# Patient Record
Sex: Female | Born: 1995 | Race: Black or African American | Hispanic: No | Marital: Single | Smoking: Never smoker
Health system: Southern US, Community
[De-identification: ages and names within clinical notes are randomized; demographics above are authoritative.]

## PROBLEM LIST (undated history)

## (undated) ENCOUNTER — Inpatient Hospital Stay (HOSPITAL_COMMUNITY): Payer: Self-pay

## (undated) DIAGNOSIS — D563 Thalassemia minor: Secondary | ICD-10-CM

## (undated) DIAGNOSIS — F419 Anxiety disorder, unspecified: Secondary | ICD-10-CM

## (undated) DIAGNOSIS — D649 Anemia, unspecified: Secondary | ICD-10-CM

## (undated) DIAGNOSIS — B999 Unspecified infectious disease: Secondary | ICD-10-CM

## (undated) HISTORY — PX: NO PAST SURGERIES: SHX2092

---

## 2016-04-10 ENCOUNTER — Encounter (HOSPITAL_COMMUNITY): Payer: Self-pay | Admitting: Emergency Medicine

## 2016-04-10 ENCOUNTER — Emergency Department (HOSPITAL_COMMUNITY)
Admission: EM | Admit: 2016-04-10 | Discharge: 2016-04-10 | Discharge status: Against Medical Advice | Payer: BLUE CROSS/BLUE SHIELD | Attending: Dermatology | Admitting: Dermatology

## 2016-04-10 DIAGNOSIS — Z87891 Personal history of nicotine dependence: Secondary | ICD-10-CM | POA: Insufficient documentation

## 2016-04-10 DIAGNOSIS — Z5321 Procedure and treatment not carried out due to patient leaving prior to being seen by health care provider: Secondary | ICD-10-CM | POA: Insufficient documentation

## 2016-04-10 DIAGNOSIS — R42 Dizziness and giddiness: Secondary | ICD-10-CM | POA: Insufficient documentation

## 2016-04-10 LAB — CBC WITH DIFFERENTIAL/PLATELET
BASOS PCT: 1 %
Basophils Absolute: 0.1 10*3/uL (ref 0.0–0.1)
EOS ABS: 0.1 10*3/uL (ref 0.0–0.7)
EOS PCT: 1 %
HCT: 37.3 % (ref 36.0–46.0)
HEMOGLOBIN: 12 g/dL (ref 12.0–15.0)
LYMPHS ABS: 2.4 10*3/uL (ref 0.7–4.0)
Lymphocytes Relative: 46 %
MCH: 27.8 pg (ref 26.0–34.0)
MCHC: 32.2 g/dL (ref 30.0–36.0)
MCV: 86.5 fL (ref 78.0–100.0)
MONOS PCT: 6 %
Monocytes Absolute: 0.3 10*3/uL (ref 0.1–1.0)
NEUTROS PCT: 46 %
Neutro Abs: 2.4 10*3/uL (ref 1.7–7.7)
PLATELETS: 213 10*3/uL (ref 150–400)
RBC: 4.31 MIL/uL (ref 3.87–5.11)
RDW: 12.9 % (ref 11.5–15.5)
WBC: 5.3 10*3/uL (ref 4.0–10.5)

## 2016-04-10 LAB — COMPREHENSIVE METABOLIC PANEL
ALBUMIN: 3.8 g/dL (ref 3.5–5.0)
ALK PHOS: 90 U/L (ref 38–126)
ALT: 29 U/L (ref 14–54)
ANION GAP: 8 (ref 5–15)
AST: 20 U/L (ref 15–41)
BUN: 8 mg/dL (ref 6–20)
CHLORIDE: 104 mmol/L (ref 101–111)
CO2: 25 mmol/L (ref 22–32)
Calcium: 9.3 mg/dL (ref 8.9–10.3)
Creatinine, Ser: 0.61 mg/dL (ref 0.44–1.00)
GFR calc non Af Amer: >60 mL/min (ref >60)
GLUCOSE: 104 mg/dL — AB (ref 65–99)
POTASSIUM: 3.5 mmol/L (ref 3.5–5.1)
SODIUM: 137 mmol/L (ref 135–145)
Total Bilirubin: 0.3 mg/dL (ref 0.3–1.2)
Total Protein: 6.7 g/dL (ref 6.5–8.1)

## 2016-04-10 LAB — CBG MONITORING, ED: Glucose-Capillary: 99 mg/dL (ref 65–99)

## 2016-04-10 NOTE — ED Notes (Signed)
Pt st's today at work she started feeling lightheaded and shaky.  St;s this has happened before and she would feel better after she ate.  Pt st's today eating hasn't helped.  Pt denies nausea or vomiting

## 2016-04-11 ENCOUNTER — Encounter (HOSPITAL_COMMUNITY): Payer: Self-pay | Admitting: Emergency Medicine

## 2016-04-11 ENCOUNTER — Ambulatory Visit (HOSPITAL_COMMUNITY)
Admission: EM | Admit: 2016-04-11 | Discharge: 2016-04-11 | Disposition: A | Discharge status: Home / Self Care | Payer: Self-pay | Attending: Family Medicine | Admitting: Family Medicine

## 2016-04-11 DIAGNOSIS — R0602 Shortness of breath: Secondary | ICD-10-CM

## 2016-04-11 DIAGNOSIS — R42 Dizziness and giddiness: Secondary | ICD-10-CM

## 2016-04-11 DIAGNOSIS — F419 Anxiety disorder, unspecified: Secondary | ICD-10-CM

## 2016-04-11 MED ORDER — ALBUTEROL SULFATE HFA 108 (90 BASE) MCG/ACT IN AERS: 2.0000 | INHALATION_SPRAY | RESPIRATORY_TRACT | Status: DC | PRN

## 2016-04-11 MED ORDER — MECLIZINE HCL 25 MG PO TABS: 25.0000 mg | ORAL_TABLET | Freq: Three times a day (TID) | ORAL | Status: DC | PRN

## 2016-04-11 NOTE — ED Provider Notes (Signed)
CSN: [id number]     Arrival date & time 04/11/16  1534 History   None    Chief Complaint  Patient presents with  . Dizziness   (Consider location/radiation/quality/duration/timing/severity/associated sxs/prior Treatment) HPI Comments: Patient c/o sob when exercising and c/o exercised induced wheezing.  Patient is a 20 y.o. female presenting with dizziness.  Dizziness Quality:  Vertigo Severity:  Mild Onset quality:  Sudden Duration:  1 day Timing:  Intermittent Progression:  Waxing and waning Chronicity:  New Context: bending over and head movement   Relieved by:  Nothing Worsened by:  Nothing Associated symptoms: shortness of breath     History reviewed. No pertinent past medical history. History reviewed. No pertinent past surgical history. No family history on file. Social History  Substance Use Topics  . Smoking status: Former Games developermoker  . Smokeless tobacco: None  . Alcohol Use: No   OB History    No data available     Review of Systems  Constitutional: Negative.   HENT: Negative.   Eyes: Negative.   Respiratory: Positive for shortness of breath.   Cardiovascular: Negative.   Genitourinary: Negative.   Allergic/Immunologic: Negative.   Neurological: Positive for dizziness.  Hematological: Negative.   Psychiatric/Behavioral: Negative.     Allergies  Review of patient's allergies indicates no known allergies.  Home Medications   Prior to Admission medications   Not on File   Meds Ordered and Administered this Visit  Medications - No data to display  BP 143/74 mmHg  Pulse 87  Temp(Src) 98.9 F (37.2 C) (Oral)  Resp 16  SpO2 100%  LMP 03/31/2016 No data found.   Physical Exam  Constitutional: She is oriented to person, place, and time. She appears well-developed and well-nourished.  HENT:  Head: Normocephalic.  Right Ear: External ear normal.  Left Ear: External ear normal.  Mouth/Throat: Oropharynx is clear and moist.  Eyes: Conjunctivae  and EOM are normal. Pupils are equal, round, and reactive to light.  Neck: Normal range of motion. Neck supple.  Cardiovascular: Normal rate, regular rhythm and normal heart sounds.   Pulmonary/Chest: Effort normal and breath sounds normal.  Abdominal: Soft. Bowel sounds are normal.  Musculoskeletal: Normal range of motion.  Neurological: She is alert and oriented to person, place, and time.    ED Course  Procedures (including critical care time)  Labs Review Labs Reviewed - No data to display  Imaging Review No results found.   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

## 2016-04-11 NOTE — Discharge Instructions (Signed)
Follow up with Primary Care for anxiety and get physical exam.   Benign Positional Vertigo Vertigo is the feeling that you or your surroundings are moving when they are not. Benign positional vertigo is the most common form of vertigo. The cause of this condition is not serious (is benign). This condition is triggered by certain movements and positions (is positional). This condition can be dangerous if it occurs while you are doing something that could endanger you or others, such as driving.  CAUSES In many cases, the cause of this condition is not known. It may be caused by a disturbance in an area of the inner ear that helps your brain to sense movement and balance. This disturbance can be caused by a viral infection (labyrinthitis), head injury, or repetitive motion. RISK FACTORS This condition is more likely to develop in:  Women.  People who are 22 years of age or older. SYMPTOMS Symptoms of this condition usually happen when you move your head or your eyes in different directions. Symptoms may start suddenly, and they usually last for less than a minute. Symptoms may include:  Loss of balance and falling.  Feeling like you are spinning or moving.  Feeling like your surroundings are spinning or moving.  Nausea and vomiting.  Blurred vision.  Dizziness.  Involuntary eye movement (nystagmus). Symptoms can be mild and cause only slight annoyance, or they can be severe and interfere with daily life. Episodes of benign positional vertigo may return (recur) over time, and they may be triggered by certain movements. Symptoms may improve over time. DIAGNOSIS This condition is usually diagnosed by medical history and a physical exam of the head, neck, and ears. You may be referred to a health care provider who specializes in ear, nose, and throat (ENT) problems (otolaryngologist) or a provider who specializes in disorders of the nervous system (neurologist). You may have additional  testing, including:  MRI.  A CT scan.  Eye movement tests. Your health care provider may ask you to change positions quickly while he or she watches you for symptoms of benign positional vertigo, such as nystagmus. Eye movement may be tested with an electronystagmogram (ENG), caloric stimulation, the Dix-Hallpike test, or the roll test.  An electroencephalogram (EEG). This records electrical activity in your brain.  Hearing tests. TREATMENT Usually, your health care provider will treat this by moving your head in specific positions to adjust your inner ear back to normal. Surgery may be needed in severe cases, but this is rare. In some cases, benign positional vertigo may resolve on its own in 2-4 weeks. HOME CARE INSTRUCTIONS Safety  Move slowly.Avoid sudden body or head movements.  Avoid driving.  Avoid operating heavy machinery.  Avoid doing any tasks that would be dangerous to you or others if a vertigo episode would occur.  If you have trouble walking or keeping your balance, try using a cane for stability. If you feel dizzy or unstable, sit down right away.  Return to your normal activities as told by your health care provider. Ask your health care provider what activities are safe for you. General Instructions  Take over-the-counter and prescription medicines only as told by your health care provider.  Avoid certain positions or movements as told by your health care provider.  Drink enough fluid to keep your urine clear or pale yellow.  Keep all follow-up visits as told by your health care provider. This is important. SEEK MEDICAL CARE IF:  You have a fever.  Your  condition gets worse or you develop new symptoms.  Your family or friends notice any behavioral changes.  Your nausea or vomiting gets worse.  You have numbness or a "pins and needles" sensation. SEEK IMMEDIATE MEDICAL CARE IF:  You have difficulty speaking or moving.  You are always dizzy.  You  faint.  You develop severe headaches.  You have weakness in your legs or arms.  You have changes in your hearing or vision.  You develop a stiff neck.  You develop sensitivity to light.   This information is not intended to replace advice given to you by your health care provider. Make sure you discuss any questions you have with your health care provider.   Document Released: 07/21/2006 Document Revised: 07/04/2015 Document Reviewed: 02/05/2015 Elsevier Interactive Patient Education [employer]

## 2016-04-11 NOTE — ED Notes (Signed)
Pt reports she feels lightheaded onset yest... [facility] sx but left b/c wait is too long... Denies CP... A&O x4... No acute distress.

## 2016-05-16 ENCOUNTER — Ambulatory Visit (HOSPITAL_COMMUNITY)
Admission: EM | Admit: 2016-05-16 | Discharge: 2016-05-16 | Disposition: A | Discharge status: Home / Self Care | Payer: Self-pay | Attending: Family Medicine | Admitting: Family Medicine

## 2016-05-16 ENCOUNTER — Encounter (HOSPITAL_COMMUNITY): Payer: Self-pay | Admitting: *Deleted

## 2016-05-16 DIAGNOSIS — N390 Urinary tract infection, site not specified: Secondary | ICD-10-CM

## 2016-05-16 LAB — POCT URINALYSIS DIP (DEVICE)
Bilirubin Urine: NEGATIVE
Glucose, UA: NEGATIVE mg/dL
Ketones, ur: NEGATIVE mg/dL
Nitrite: NEGATIVE
SPECIFIC GRAVITY, URINE: 1.025 (ref 1.005–1.030)
UROBILINOGEN UA: 0.2 mg/dL (ref 0.0–1.0)
pH: 6.5 (ref 5.0–8.0)

## 2016-05-16 LAB — POCT PREGNANCY, URINE: Preg Test, Ur: NEGATIVE

## 2016-05-16 MED ORDER — CEPHALEXIN 500 MG PO CAPS: 500.0000 mg | ORAL_CAPSULE | Freq: Four times a day (QID) | ORAL | Status: DC

## 2016-05-16 NOTE — Discharge Instructions (Signed)
Take all of medicine as directed, drink lots of fluids, see your doctor if further problems. °

## 2016-05-16 NOTE — ED Provider Notes (Signed)
CSN: [id number]     Arrival date & time 05/16/16  1519 History   First MD Initiated Contact with Patient 05/16/16 1610     Chief Complaint  Patient presents with  . Urinary Tract Infection   (Consider location/radiation/quality/duration/timing/severity/associated sxs/prior Treatment) Patient is a 20 y.o. female presenting with urinary tract infection. The history is provided by the patient.  Urinary Tract Infection Pain quality:  Burning Pain severity:  Mild Onset quality:  Sudden Duration:  1 day Progression:  Unchanged Chronicity:  New Recent urinary tract infections: no   Relieved by:  None tried Worsened by:  Nothing tried Ineffective treatments:  None tried Urinary symptoms: frequent urination   Associated symptoms: no abdominal pain, no fever, no flank pain, no nausea, no vaginal discharge and no vomiting   Risk factors: no recurrent urinary tract infections     History reviewed. No pertinent past medical history. History reviewed. No pertinent past surgical history. History reviewed. No pertinent family history. Social History  Substance Use Topics  . Smoking status: Former Games developer  . Smokeless tobacco: None  . Alcohol Use: No   OB History    No data available     Review of Systems  Constitutional: Negative.  Negative for fever.  Gastrointestinal: Negative.  Negative for nausea, vomiting and abdominal pain.  Genitourinary: Positive for dysuria, urgency and frequency. Negative for flank pain, vaginal bleeding, vaginal discharge, vaginal pain and pelvic pain.  All other systems reviewed and are negative.   Allergies  Review of patient's allergies indicates no known allergies.  Home Medications   Prior to Admission medications   Medication Sig Start Date End Date Taking? Authorizing Provider  albuterol (PROVENTIL HFA;VENTOLIN HFA) 108 (90 Base) MCG/ACT inhaler Inhale 2 puffs into the lungs every 4 (four) hours as needed for wheezing or shortness of breath.  04/11/16   Deatra Canter, FNP  cephALEXin (KEFLEX) 500 MG capsule Take 1 capsule (500 mg total) by mouth 4 (four) times daily. Take all of medicine and drink lots of fluids 05/16/16   Linna Hoff, MD  meclizine (ANTIVERT) 25 MG tablet Take 1 tablet (25 mg total) by mouth 3 (three) times daily as needed for dizziness. 04/11/16   Deatra Canter, FNP   Meds Ordered and Administered this Visit  Medications - No data to display  BP 136/85 mmHg  Pulse 104  Temp(Src) 98.5 F (36.9 C) (Oral)  Resp 12  SpO2 99%  LMP 04/27/2016 No data found.   Physical Exam  Constitutional: She is oriented to person, place, and time. She appears well-developed and well-nourished.  Abdominal: Soft. Bowel sounds are normal. She exhibits no mass. There is no tenderness. There is no rebound and no guarding.  Neurological: She is alert and oriented to person, place, and time.  Skin: Skin is warm and dry.  Nursing note and vitals reviewed.   ED Course  Procedures (including critical care time)  Labs Review Labs Reviewed  POCT URINALYSIS DIP (DEVICE) - Abnormal; Notable for the following:    Hgb urine dipstick LARGE (*)    Protein, ur >=300 (*)    Leukocytes, UA LARGE (*)    All other components within normal limits  POCT PREGNANCY, URINE   U/a abnl. U/preg neg  Imaging Review No results found.   Visual Acuity Review  Right Eye Distance:   Left Eye Distance:   Bilateral Distance:    Right Eye Near:   Left Eye Near:    Bilateral Near:  MDM   1. UTI (lower urinary tract infection)        Linna HoffJames D Kaysin Brock, MD 05/16/16 [phone]

## 2016-05-16 NOTE — ED Notes (Signed)
Pt reports      Symptoms      Of     Blood  In urine   With         frequecy  And  Discomfort    With  Onset      Of symptoms      sev  Days   denys  Vaginal bleeding or discharge

## 2016-09-06 ENCOUNTER — Ambulatory Visit (HOSPITAL_COMMUNITY)
Admission: EM | Admit: 2016-09-06 | Discharge: 2016-09-06 | Disposition: A | Discharge status: Home / Self Care | Payer: BLUE CROSS/BLUE SHIELD | Attending: Internal Medicine | Admitting: Internal Medicine

## 2016-09-06 ENCOUNTER — Encounter (HOSPITAL_COMMUNITY): Payer: Self-pay | Admitting: Emergency Medicine

## 2016-09-06 DIAGNOSIS — B349 Viral infection, unspecified: Secondary | ICD-10-CM

## 2016-09-06 LAB — POCT URINALYSIS DIP (DEVICE)
Bilirubin Urine: NEGATIVE
GLUCOSE, UA: NEGATIVE mg/dL
Ketones, ur: NEGATIVE mg/dL
Leukocytes, UA: NEGATIVE
Nitrite: NEGATIVE
PROTEIN: NEGATIVE mg/dL
SPECIFIC GRAVITY, URINE: 1.02 (ref 1.005–1.030)
UROBILINOGEN UA: 0.2 mg/dL (ref 0.0–1.0)
pH: 7.5 (ref 5.0–8.0)

## 2016-09-06 LAB — POCT PREGNANCY, URINE: PREG TEST UR: NEGATIVE

## 2016-09-06 MED ORDER — FAMOTIDINE 40 MG PO TABS: 40.0000 mg | ORAL_TABLET | Freq: Two times a day (BID) | ORAL | 0 refills | Status: DC

## 2016-09-06 NOTE — Discharge Instructions (Addendum)
Symptoms of stomach upset and dizziness seem most likely to be related to a stomach bug.  Symptoms are improving, so no further workup necessary at this time.  Prescription for famotidine, for nausea/upper stomach discomfort, [facility] [address].  Recheck for new fever >100.5, persistent vomiting, or if not continuing to improve over the next several days.   Tongue piercing does not seem to be actively infected but would recheck for increasing swelling/pain/drainage/redness.

## 2016-09-06 NOTE — ED Triage Notes (Signed)
Onset 2 days ago, no appetite, no vomiting.  No diarrhea.  Reports lower abdominal cramping today that has since resolved.  Denies urinary symptoms, no vaginal discharge.  Last bm was today.  lmp October 22  Reported as normal.  Patient here for no appetite and because she felt dizzy for 2 days, not today

## 2016-09-06 NOTE — ED Provider Notes (Signed)
[facility]    CSN: [id number] Arrival date & time: 09/06/16  1327     History   Chief Complaint No chief complaint on file.   HPI Melanie Valencia is a 20 y.o. female. She presents today with the onset 3 days ago of nausea, emesis 1, malaise, lightheadedness. The symptoms are all improving, although she doesn't feel like herself yet. She is under a lot of stress, financial and relational, not sleeping at night. No dysuria, does have some frequency but also increased her water intake. Little bit of lower abdominal crampiness today, improved after a bowel movement. Normal bowel movement today. No unusual vaginal bleeding. Chronically has a tiny amount of vaginal discharge which is unchanged. No fever. She is worried that her tongue piercing is infected. She had this done about a month ago, 2 weeks ago noticed a little bit of swelling. No pain, no drainage.  HPI  History reviewed. No pertinent past medical history.  History reviewed. No pertinent surgical history.     Home Medications    Prior to Admission medications   Medication Sig Start Date End Date Taking? Authorizing Provider  albuterol (PROVENTIL HFA;VENTOLIN HFA) 108 (90 Base) MCG/ACT inhaler Inhale 2 puffs into the lungs every 4 (four) hours as needed for wheezing or shortness of breath. 04/11/16   Deatra CanterWilliam J Oxford, FNP  famotidine (PEPCID) 40 MG tablet Take 1 tablet (40 mg total) by mouth 2 (two) times daily. 09/06/16 09/20/16  Eustace MooreLaura W Xenia Nile, MD    Family History No family history on file.  Social History Social History  Substance Use Topics  . Smoking status: Former Games developermoker  . Smokeless tobacco: Not on file  . Alcohol use Yes     Allergies   Patient has no known allergies.   Review of Systems Review of Systems  All other systems reviewed and are negative.    Physical Exam Triage Vital Signs ED Triage Vitals [09/06/16 1445]  Enc Vitals Group     BP 129/74     Pulse Rate 96     Resp 18   Temp 99 F (37.2 C)     Temp Source Oral     SpO2 100 %     Weight      Height    No data found.   Updated Vital Signs BP 129/84 (BP Location: Left Arm)   Pulse 89   Temp 99.2 F (37.3 C) (Oral)   Resp 18   LMP 08/17/2016   SpO2 100%  Physical Exam  Constitutional: She is oriented to person, place, and time. No distress.  Alert, nicely groomed  HENT:  Head: Atraumatic.  Bar type piercing at the tip of the tongue, both ends are whitish, feel a little firm/fibrous, no drainage is expressible, no tenderness appreciated. No erythema, overall tongue not swollen.  Eyes:  Conjugate gaze, no eye redness/drainage  Neck: Neck supple.  Cardiovascular: Normal rate and regular rhythm.   Pulmonary/Chest: No respiratory distress. She has no wheezes. She has no rales.  Lungs clear, symmetric breath sounds  Abdominal: Soft. She exhibits no distension. There is no tenderness. There is no guarding.  Musculoskeletal: Normal range of motion.  No leg swelling  Neurological: She is alert and oriented to person, place, and time.  Skin: Skin is warm and dry.  No cyanosis  Nursing note and vitals reviewed.    UC Treatments / Results  Labs Results for orders placed or performed during the hospital encounter of 09/06/16  POCT  urinalysis dip (device)  Result Value Ref Range   Glucose, UA NEGATIVE NEGATIVE mg/dL   Bilirubin Urine NEGATIVE NEGATIVE   Ketones, ur NEGATIVE NEGATIVE mg/dL   Specific Gravity, Urine 1.020 1.005 - 1.030   Hgb urine dipstick TRACE (A) NEGATIVE   pH 7.5 5.0 - 8.0   Protein, ur NEGATIVE NEGATIVE mg/dL   Urobilinogen, UA 0.2 0.0 - 1.0 mg/dL   Nitrite NEGATIVE NEGATIVE   Leukocytes, UA NEGATIVE NEGATIVE  Pregnancy, urine POC  Result Value Ref Range   Preg Test, Ur NEGATIVE NEGATIVE    Procedures Procedures (including critical care time) None today  Final Clinical Impressions(s) / UC Diagnoses   Final diagnoses:  Nonspecific syndrome suggestive of viral  illness   Symptoms of stomach upset and dizziness seem most likely to be related to a stomach bug.  Symptoms are improving, so no further workup necessary at this time.  Prescription for famotidine, for nausea/upper stomach discomfort, [facility] [address]  Recheck for new fever >100.5, persistent vomiting, or if not continuing to improve over the next several days.   Tongue piercing does not seem to be actively infected but would recheck for increasing swelling/pain/drainage/redness.  New Prescriptions Discharge Medication List as of 09/06/2016  3:40 PM    START taking these medications   Details  famotidine (PEPCID) 40 MG tablet Take 1 tablet (40 mg total) by mouth 2 (two) times daily., Starting Sat 09/06/2016, Until Sat 09/20/2016, Normal         Eustace MooreLaura W Teagen Mcleary, MD 09/12/16 [phone]

## 2016-10-23 ENCOUNTER — Encounter (HOSPITAL_COMMUNITY): Payer: Self-pay

## 2016-10-23 ENCOUNTER — Emergency Department (HOSPITAL_COMMUNITY)
Admission: EM | Admit: 2016-10-23 | Discharge: 2016-10-23 | Disposition: A | Discharge status: Home / Self Care | Payer: BLUE CROSS/BLUE SHIELD | Attending: Emergency Medicine | Admitting: Emergency Medicine

## 2016-10-23 DIAGNOSIS — Z87891 Personal history of nicotine dependence: Secondary | ICD-10-CM | POA: Diagnosis not present

## 2016-10-23 DIAGNOSIS — N390 Urinary tract infection, site not specified: Secondary | ICD-10-CM | POA: Diagnosis not present

## 2016-10-23 DIAGNOSIS — R300 Dysuria: Secondary | ICD-10-CM | POA: Diagnosis present

## 2016-10-23 DIAGNOSIS — Z79899 Other long term (current) drug therapy: Secondary | ICD-10-CM | POA: Diagnosis not present

## 2016-10-23 LAB — BASIC METABOLIC PANEL
ANION GAP: 9 (ref 5–15)
BUN: 8 mg/dL (ref 6–20)
CO2: 26 mmol/L (ref 22–32)
Calcium: 9.3 mg/dL (ref 8.9–10.3)
Chloride: 105 mmol/L (ref 101–111)
Creatinine, Ser: 0.61 mg/dL (ref 0.44–1.00)
GFR calc Af Amer: >60 mL/min (ref >60)
GLUCOSE: 108 mg/dL — AB (ref 65–99)
POTASSIUM: 3.6 mmol/L (ref 3.5–5.1)
Sodium: 140 mmol/L (ref 135–145)

## 2016-10-23 LAB — URINALYSIS, ROUTINE W REFLEX MICROSCOPIC
Bilirubin Urine: NEGATIVE
GLUCOSE, UA: NEGATIVE mg/dL
Ketones, ur: NEGATIVE mg/dL
NITRITE: NEGATIVE
PROTEIN: 100 mg/dL — AB
Specific Gravity, Urine: 1.018 (ref 1.005–1.030)
pH: 7 (ref 5.0–8.0)

## 2016-10-23 LAB — POC URINE PREG, ED: Preg Test, Ur: NEGATIVE

## 2016-10-23 LAB — RAPID HIV SCREEN (HIV 1/2 AB+AG)
HIV 1/2 Antibodies: NONREACTIVE
HIV-1 P24 Antigen - HIV24: NONREACTIVE

## 2016-10-23 MED ORDER — CEPHALEXIN 500 MG PO CAPS: 500.0000 mg | ORAL_CAPSULE | Freq: Two times a day (BID) | ORAL | 0 refills | Status: DC

## 2016-10-23 NOTE — ED Notes (Signed)
Pt. To bathroom.

## 2016-10-23 NOTE — ED Triage Notes (Signed)
Pt reports urinary frequency. She also reports a small amount of urinary frequency. She reports burning with urination. She reports hx of UTI.

## 2016-10-23 NOTE — ED Provider Notes (Signed)
[facility]   CSN: [id number] Arrival date & time: 10/23/16  1254     History   Chief Complaint Chief Complaint  Patient presents with  . Dysuria    HPI Melanie Valencia is a 20 y.o. female.  Pt reports urinary frequency. She also reports a small amount of urinary urgency. She reports burning with urination. She reports hx of UTI. No vomiting, no flank pain. Mild suprapubic pain. No history of prior surgeries. Patient denies any vaginal discharge.   The history is provided by the patient. No language interpreter was used.  Dysuria   This is a new problem. The current episode started 12 to 24 hours ago. The problem occurs every urination. The problem has been gradually worsening. The quality of the pain is described as burning and shooting. The pain is mild. There has been no fever. She is sexually active. Associated symptoms include urgency. Pertinent negatives include no vomiting, no frequency, no hematuria and no possible pregnancy. She has tried nothing for the symptoms. Her past medical history is significant for recurrent UTIs. Her past medical history does not include urological procedure or catheterization.    History reviewed. No pertinent past medical history.  There are no active problems to display for this patient.   History reviewed. No pertinent surgical history.  OB History    No data available       Home Medications    Prior to Admission medications   Medication Sig Start Date End Date Taking? Authorizing Provider  albuterol (PROVENTIL HFA;VENTOLIN HFA) 108 (90 Base) MCG/ACT inhaler Inhale 2 puffs into the lungs every 4 (four) hours as needed for wheezing or shortness of breath. 04/11/16   Deatra CanterWilliam J Oxford, FNP  cephALEXin (KEFLEX) 500 MG capsule Take 1 capsule (500 mg total) by mouth 2 (two) times daily. 10/23/16   Niel Hummeross Knight Oelkers, MD  famotidine (PEPCID) 40 MG tablet Take 1 tablet (40 mg total) by mouth 2 (two) times daily. 09/06/16 09/20/16   Eustace MooreLaura W Murray, MD    Family History History reviewed. No pertinent family history.  Social History Social History  Substance Use Topics  . Smoking status: Former Games developermoker  . Smokeless tobacco: Never Used  . Alcohol use Yes     Allergies   Patient has no known allergies.   Review of Systems Review of Systems  Gastrointestinal: Negative for vomiting.  Genitourinary: Positive for dysuria and urgency. Negative for frequency and hematuria.  All other systems reviewed and are negative.    Physical Exam Updated Vital Signs BP 129/69 (BP Location: Left Arm)   Pulse 90   Temp 99.1 F (37.3 C) (Oral)   Resp 18   LMP 10/15/2016 (Within Days)   SpO2 100%   Physical Exam  Constitutional: She is oriented to person, place, and time. She appears well-developed and well-nourished.  HENT:  Head: Normocephalic and atraumatic.  Right Ear: External ear normal.  Left Ear: External ear normal.  Mouth/Throat: Oropharynx is clear and moist.  Eyes: Conjunctivae and EOM are normal.  Neck: Normal range of motion. Neck supple.  Cardiovascular: Normal rate, normal heart sounds and intact distal pulses.   Pulmonary/Chest: Effort normal and breath sounds normal. She has no wheezes. She has no rales.  Abdominal: Soft. Bowel sounds are normal. She exhibits no mass. There is no tenderness. There is no rebound and no guarding.  Musculoskeletal: Normal range of motion.  Neurological: She is alert and oriented to person, place, and time. She displays normal reflexes.  Coordination normal.  Skin: Skin is warm.  Nursing note and vitals reviewed.    ED Treatments / Results  Labs (all labs ordered are listed, but only abnormal results are displayed) Labs Reviewed  URINE CULTURE - Abnormal; Notable for the following:       Result Value   Culture MULTIPLE SPECIES PRESENT, SUGGEST RECOLLECTION (*)    All other components within normal limits  URINALYSIS, ROUTINE W REFLEX MICROSCOPIC - Abnormal;  Notable for the following:    APPearance CLOUDY (*)    Hgb urine dipstick LARGE (*)    Protein, ur 100 (*)    Leukocytes, UA MODERATE (*)    Bacteria, UA RARE (*)    Squamous Epithelial / LPF 0-5 (*)    All other components within normal limits  BASIC METABOLIC PANEL - Abnormal; Notable for the following:    Glucose, Bld 108 (*)    All other components within normal limits  RAPID HIV SCREEN (HIV 1/2 AB+AG)  RPR  POC URINE PREG, ED    EKG  EKG Interpretation None       Radiology No results found.  Procedures Procedures (including critical care time)  Medications Ordered in ED Medications - No data to display   Initial Impression / Assessment and Plan / ED Course  I have reviewed the triage vital signs and the nursing notes.  Pertinent labs & imaging results that were available during my care of the patient were reviewed by me and considered in my medical decision making (see chart for details).  Clinical Course     20 year old who presents with dysuria, urinary frequency and urgency. Concern for likely UTI. We'll obtain UA and urine pregnancy.  Patient asking to be tested for HIV, so will send rapid test. We'll also check renal function.  Pt with likely UTI.  We'll place patient on antibiotics.   Will notify patient of results at [phone].   Called on 12/30 and notfied of results of HIV, and renal fx.   Final Clinical Impressions(s) / ED Diagnoses   Final diagnoses:  Lower urinary tract infectious disease    New Prescriptions Discharge Medication List as of 10/23/2016  4:26 PM    START taking these medications   Details  cephALEXin (KEFLEX) 500 MG capsule Take 1 capsule (500 mg total) by mouth 2 (two) times daily., Starting Thu 10/23/2016, Print         Niel Hummeross Davonne Baby, MD 10/25/16 [phone]

## 2016-10-24 LAB — RPR: RPR Ser Ql: NONREACTIVE

## 2016-10-25 LAB — URINE CULTURE

## 2017-04-28 ENCOUNTER — Encounter (HOSPITAL_COMMUNITY): Payer: Self-pay | Admitting: *Deleted

## 2017-04-28 ENCOUNTER — Ambulatory Visit (HOSPITAL_COMMUNITY)
Admission: EM | Admit: 2017-04-28 | Discharge: 2017-04-28 | Disposition: A | Discharge status: Home / Self Care | Payer: BLUE CROSS/BLUE SHIELD | Attending: Internal Medicine | Admitting: Internal Medicine

## 2017-04-28 DIAGNOSIS — R1031 Right lower quadrant pain: Secondary | ICD-10-CM

## 2017-04-28 DIAGNOSIS — R1030 Lower abdominal pain, unspecified: Secondary | ICD-10-CM | POA: Insufficient documentation

## 2017-04-28 DIAGNOSIS — Z3202 Encounter for pregnancy test, result negative: Secondary | ICD-10-CM

## 2017-04-28 DIAGNOSIS — R300 Dysuria: Secondary | ICD-10-CM

## 2017-04-28 DIAGNOSIS — Z79899 Other long term (current) drug therapy: Secondary | ICD-10-CM | POA: Insufficient documentation

## 2017-04-28 DIAGNOSIS — R1032 Left lower quadrant pain: Secondary | ICD-10-CM

## 2017-04-28 DIAGNOSIS — N898 Other specified noninflammatory disorders of vagina: Secondary | ICD-10-CM | POA: Insufficient documentation

## 2017-04-28 LAB — POCT URINALYSIS DIP (DEVICE)
Bilirubin Urine: NEGATIVE
GLUCOSE, UA: NEGATIVE mg/dL
Ketones, ur: NEGATIVE mg/dL
Nitrite: NEGATIVE
Protein, ur: NEGATIVE mg/dL
SPECIFIC GRAVITY, URINE: 1.015 (ref 1.005–1.030)
UROBILINOGEN UA: 0.2 mg/dL (ref 0.0–1.0)
pH: 7 (ref 5.0–8.0)

## 2017-04-28 LAB — POCT PREGNANCY, URINE: Preg Test, Ur: NEGATIVE

## 2017-04-28 NOTE — ED Triage Notes (Addendum)
Pt  Reports  Low   abd    Cramping /  Pressure    With  Scanty  Out  Put        Cloudy   Urine     hix  Of  Recurrent  uti   In past   Pt had  Vaginal     approx  1   Week  Ago  And  It  [employer]  Away

## 2017-04-28 NOTE — ED Provider Notes (Addendum)
CSN: [id number]     Arrival date & time 04/28/17  1114 History   First MD Initiated Contact with Patient 04/28/17 1215     Chief Complaint  Patient presents with  . Urinary Tract Infection   (Consider location/radiation/quality/duration/timing/severity/associated sxs/prior Treatment) HPI Melanie Valencia is a 21 y.o. female presenting to UC with c/o lower abdominal cramping/pressure with small urine output, cloudy urine.  Hx of recurrent UTIs in the past.  She did have small amount of vaginal discharge about 1 week ago but then it went away. Pt does not believe she has an STI but would not mind being tested while here.  Denies fever, chills, n/v/d.    History reviewed. No pertinent past medical history. History reviewed. No pertinent surgical history. History reviewed. No pertinent family history. Social History  Substance Use Topics  . Smoking status: Former Games developer  . Smokeless tobacco: Never Used  . Alcohol use Yes   OB History    No data available     Review of Systems  Constitutional: Positive for fever (in triage). Negative for chills.  Gastrointestinal: Positive for abdominal pain (lower cramping, pressure). Negative for diarrhea, nausea and vomiting.  Genitourinary: Positive for decreased urine volume, dysuria, frequency, urgency and vaginal discharge. Negative for flank pain, hematuria, pelvic pain, vaginal bleeding and vaginal pain.  Musculoskeletal: Negative for back pain and myalgias.  Neurological: Negative for dizziness, light-headedness and headaches.    Allergies  Patient has no known allergies.  Home Medications   Prior to Admission medications   Medication Sig Start Date End Date Taking? Authorizing Provider  albuterol (PROVENTIL HFA;VENTOLIN HFA) 108 (90 Base) MCG/ACT inhaler Inhale 2 puffs into the lungs every 4 (four) hours as needed for wheezing or shortness of breath. 04/11/16   Deatra Canter, FNP  cephALEXin (KEFLEX) 500 MG capsule Take 1 capsule (500 mg  total) by mouth 2 (two) times daily. 10/23/16   Niel Hummer, MD  famotidine (PEPCID) 40 MG tablet Take 1 tablet (40 mg total) by mouth 2 (two) times daily. 09/06/16 09/20/16  Eustace Moore, MD   Meds Ordered and Administered this Visit  Medications - No data to display  BP (!) 132/58 (BP Location: Right Arm)   Pulse (!) 102   Temp 100.1 F (37.8 C) (Oral)   Resp 18   LMP 03/31/2017   SpO2 100%  No data found.   Physical Exam  Constitutional: She is oriented to person, place, and time. She appears well-developed and well-nourished. No distress.  HENT:  Head: Normocephalic and atraumatic.  Mouth/Throat: Oropharynx is clear and moist.  Eyes: EOM are normal.  Neck: Normal range of motion.  Cardiovascular: Normal rate and regular rhythm.   Pulmonary/Chest: Effort normal and breath sounds normal. No respiratory distress.  Abdominal: Soft. She exhibits no distension and no mass. There is tenderness (suprapubic). There is no rebound, no guarding and no CVA tenderness.  Genitourinary: Vagina normal.  Genitourinary Comments: Chaperoned exam. Normal external genitalia. Small amount of thick white discharge in vaginal canal. No bleeding. No CMT, adnexal tenderness or masses.   Musculoskeletal: Normal range of motion.  Neurological: She is alert and oriented to person, place, and time.  Skin: Skin is warm and dry. She is not diaphoretic.  Psychiatric: She has a normal mood and affect. Her behavior is normal.  Nursing note and vitals reviewed.   Urgent Care Course     Procedures (including critical care time)  Labs Review Labs Reviewed  POCT URINALYSIS DIP (DEVICE) -  Abnormal; Notable for the following:       Result Value   Hgb urine dipstick TRACE (*)    Leukocytes, UA SMALL (*)    All other components within normal limits  URINE CULTURE  POCT PREGNANCY, URINE  CERVICOVAGINAL ANCILLARY ONLY    Imaging Review No results found.    MDM   1. Dysuria   2. Bilateral lower  abdominal cramping   3. Vaginal discharge    UA not convincing for definite UTI, will send culture. GC/Chlamydia and Wet prep labs sent off  Encouraged good hydrated. May have acetaminophen and ibuprofen as needed for pain F/u with PCP as needed Refrain from sexual activities with partner until labs result.     Lurene Shadowhelps, Analisse Randle O, PA-C 04/28/17 1257    Lurene ShadowPhelps, Kaziyah Parkison O, PA-C 04/28/17 1332

## 2017-04-28 NOTE — Discharge Instructions (Signed)
°  Refrain from sexual intercourse until lab results come back.  If anything is positive, the correct medication will be called in for you.  If you do not hear back from our office in 3-4 days, please call to check on your lab results or you may check on you MyChart account. Practice safe sex by always wearing condoms.   Be sure to stay well hydrated. You may take 500mg  acetaminophen every 4-6 hours or in combination with ibuprofen 400-600mg  every 6-8 hours.    No Primary Care Doctor: Call [facility] at  [phone] - they can help you locate a primary care doctor that  accepts your insurance, provides certain services, etc. Physician Referral Service- [phone]

## 2017-04-30 LAB — URINE CULTURE: Culture: >=100000 — AB

## 2017-04-30 LAB — CERVICOVAGINAL ANCILLARY ONLY
Bacterial vaginitis: NEGATIVE
Candida vaginitis: POSITIVE — AB
Chlamydia: NEGATIVE
Neisseria Gonorrhea: NEGATIVE
Trichomonas: NEGATIVE

## 2017-05-01 ENCOUNTER — Telehealth (HOSPITAL_COMMUNITY): Payer: Self-pay | Admitting: *Deleted

## 2017-05-01 MED ORDER — FLUCONAZOLE 150 MG PO TABS: 150.0000 mg | ORAL_TABLET | Freq: Once | ORAL | 0 refills | Status: AC

## 2017-05-01 NOTE — Telephone Encounter (Signed)
Test for candida (yeast) was also positive.  Rx fluconazole was sent to the pharmacy of record, [facility] on [address] at [address] [place] [place].  Recheck for further evaluation if symptoms are not improving.  LM

## 2017-05-25 ENCOUNTER — Encounter (HOSPITAL_COMMUNITY): Payer: Self-pay | Admitting: Family Medicine

## 2017-05-25 ENCOUNTER — Ambulatory Visit (HOSPITAL_COMMUNITY)
Admission: EM | Admit: 2017-05-25 | Discharge: 2017-05-25 | Disposition: A | Discharge status: Home / Self Care | Payer: Self-pay | Attending: Family Medicine | Admitting: Family Medicine

## 2017-05-25 DIAGNOSIS — R42 Dizziness and giddiness: Secondary | ICD-10-CM | POA: Insufficient documentation

## 2017-05-25 DIAGNOSIS — N3000 Acute cystitis without hematuria: Secondary | ICD-10-CM | POA: Insufficient documentation

## 2017-05-25 LAB — POCT URINALYSIS DIP (DEVICE)
BILIRUBIN URINE: NEGATIVE
GLUCOSE, UA: NEGATIVE mg/dL
Hgb urine dipstick: NEGATIVE
KETONES UR: NEGATIVE mg/dL
Nitrite: NEGATIVE
Protein, ur: NEGATIVE mg/dL
SPECIFIC GRAVITY, URINE: 1.02 (ref 1.005–1.030)
Urobilinogen, UA: 0.2 mg/dL (ref 0.0–1.0)
pH: 7 (ref 5.0–8.0)

## 2017-05-25 MED ORDER — SULFAMETHOXAZOLE-TRIMETHOPRIM 800-160 MG PO TABS: 1.0000 | ORAL_TABLET | Freq: Two times a day (BID) | ORAL | 0 refills | Status: AC

## 2017-05-25 NOTE — ED Triage Notes (Addendum)
Pt  Has   2   Complaints        She   Reports   Got   dzzy  Today  At  Work   And  Also wants  To be  Followed  Up  With  Her  Yeast  Infection   Pt  Ambulating  With a  Steady  Fluid  Gait   Appearing in no  Severe   Distress     Skin is  Warm  And  Dry

## 2017-05-26 LAB — URINE CULTURE

## 2017-05-27 NOTE — ED Provider Notes (Signed)
  [facility]   [id number] 05/25/17 Arrival Time: 1406  ASSESSMENT & PLAN:  1. Acute cystitis without hematuria   2. Dizziness     Meds ordered this encounter  Medications  . sulfamethoxazole-trimethoprim (BACTRIM DS,SEPTRA DS) 800-160 MG tablet    Sig: Take 1 tablet by mouth 2 (two) times daily.    Dispense:  6 tablet    Refill:  0   Will treat for possible UTI. This could explain her symptoms today. Culture sent. Close observation. F/U if needed. Reviewed expectations re: course of current medical issues. Questions answered. Outlined signs and symptoms indicating need for more acute intervention. Patient verbalized understanding. After Visit Summary given.   SUBJECTIVE:  Melanie Valencia is a 21 y.o. female who presents with complaint of lightheadedness today at work. No vertigo. "Just didn't feel well". Afebrile. No recent illnesses. Symptoms lasted several minutes then resolved. No n/v/CP/SOB. Normal PO intake today. Works at [employer]. No HA/visual changes/hearing changes. No new medications.  Also reports mild dysuria noticed today. No vaginal discharge. No abdominal symptoms. No back pain.  ROS: As per HPI.   OBJECTIVE:  Vitals:   05/25/17 1504  BP: 126/73  Pulse: 96  Resp: 16  Temp: 98.8 F (37.1 C)  TempSrc: Oral  SpO2: 100%     General appearance: alert; no distress HEENT: normocephalic; atraumatic Neck: supple Lungs: clear to auscultation bilaterally Heart: regular rate and rhythm Abdomen: soft, non-tender; bowel sounds normal; no masses or organomegaly; no guarding or rebound tenderness Back: no CVA tenderness Extremities: no cyanosis or edema; symmetrical with no gross deformities Skin: warm and dry Neurologic: normal symmetric reflexes; normal gait Psychological:  alert and cooperative; normal mood and affect  U/A with leukocytes.   No Known Allergies  PMHx, SurgHx, SocialHx, Medications, and Allergies were reviewed in the Visit  Navigator and updated as appropriate.      Mardella LaymanHagler, Emmalina Espericueta, MD 05/27/17 1022

## 2017-07-14 ENCOUNTER — Encounter (HOSPITAL_COMMUNITY): Payer: Self-pay | Admitting: Emergency Medicine

## 2017-07-14 ENCOUNTER — Ambulatory Visit (HOSPITAL_COMMUNITY)
Admission: EM | Admit: 2017-07-14 | Discharge: 2017-07-14 | Disposition: A | Discharge status: Home / Self Care | Payer: Self-pay | Attending: Family Medicine | Admitting: Family Medicine

## 2017-07-14 ENCOUNTER — Other Ambulatory Visit (HOSPITAL_COMMUNITY): Payer: Self-pay | Admitting: Family Medicine

## 2017-07-14 DIAGNOSIS — Z87891 Personal history of nicotine dependence: Secondary | ICD-10-CM | POA: Insufficient documentation

## 2017-07-14 DIAGNOSIS — N3090 Cystitis, unspecified without hematuria: Secondary | ICD-10-CM

## 2017-07-14 DIAGNOSIS — N3091 Cystitis, unspecified with hematuria: Secondary | ICD-10-CM | POA: Insufficient documentation

## 2017-07-14 LAB — POCT URINALYSIS DIP (DEVICE)
BILIRUBIN URINE: NEGATIVE
GLUCOSE, UA: NEGATIVE mg/dL
Ketones, ur: NEGATIVE mg/dL
NITRITE: NEGATIVE
PH: 7 (ref 5.0–8.0)
Protein, ur: 30 mg/dL — AB
Specific Gravity, Urine: <=1.005 (ref 1.005–1.030)
Urobilinogen, UA: 0.2 mg/dL (ref 0.0–1.0)

## 2017-07-14 LAB — POCT PREGNANCY, URINE: Preg Test, Ur: NEGATIVE

## 2017-07-14 MED ORDER — SULFAMETHOXAZOLE-TRIMETHOPRIM 800-160 MG PO TABS: 1.0000 | ORAL_TABLET | Freq: Two times a day (BID) | ORAL | 0 refills | Status: DC

## 2017-07-14 NOTE — Discharge Instructions (Signed)
Consider [facility] or [facility] for assistance with contraception

## 2017-07-14 NOTE — ED Provider Notes (Signed)
  [facility]   [id number] 07/14/17 Arrival Time: 1324   SUBJECTIVE:  Melanie Valencia is a 21 y.o. female who presents to the urgent care with complaint of urinary frequency, burning, and blood in urine Symptoms began today.  LMP last week at expected time.  Mild LBP but no fever, nausea, vomiting or flank pain  H/O recurrent UTI after intercourse in past  Last intercourse (unprotected) was 10 days ago.    History reviewed. No pertinent past medical history. No family history on file. Social History   Social History  . Marital status: Single    Spouse name: N/A  . Number of children: N/A  . Years of education: N/A   Occupational History  . Not on file.   Social History Main Topics  . Smoking status: Former Games developer  . Smokeless tobacco: Never Used  . Alcohol use Yes  . Drug use: No  . Sexual activity: Yes    Birth control/ protection: None   Other Topics Concern  . Not on file   Social History Narrative  . No narrative on file   No outpatient prescriptions have been marked as taking for the 07/14/17 encounter [facility] Encounter).   No Known Allergies    ROS: As per HPI, remainder of ROS negative.   OBJECTIVE:   Vitals:   07/14/17 1341  Weight: 170 lb (77.1 kg)  Height:  (1.6 m)     General appearance: alert; no distress Eyes: PERRL; EOMI; conjunctiva normal HENT: normocephalic; atraumatic; TMs normal, canal normal, external ears normal without trauma; nasal mucosa normal; oral mucosa normal Neck: supple Lungs: clear to auscultation bilaterally Heart: regular rate and rhythm Abdomen: soft, non-tender; bowel sounds normal; no masses or organomegaly; no guarding or rebound tenderness Back: no CVA tenderness, mild LBP Extremities: no cyanosis or edema; symmetrical with no gross deformities Skin: warm and dry Neurologic: normal gait; grossly normal Psychological: alert and cooperative; normal mood and affect     Labs:  Results for  orders placed or performed during the hospital encounter of 07/14/17  POCT urinalysis dip (device)  Result Value Ref Range   Glucose, UA NEGATIVE NEGATIVE mg/dL   Bilirubin Urine NEGATIVE NEGATIVE   Ketones, ur NEGATIVE NEGATIVE mg/dL   Specific Gravity, Urine <=1.005 1.005 - 1.030   Hgb urine dipstick MODERATE (A) NEGATIVE   pH 7.0 5.0 - 8.0   Protein, ur 30 (A) NEGATIVE mg/dL   Urobilinogen, UA 0.2 0.0 - 1.0 mg/dL   Nitrite NEGATIVE NEGATIVE   Leukocytes, UA SMALL (A) NEGATIVE    Labs Reviewed  POCT URINALYSIS DIP (DEVICE) - Abnormal; Notable for the following:       Result Value   Hgb urine dipstick MODERATE (*)    Protein, ur 30 (*)    Leukocytes, UA SMALL (*)    All other components within normal limits  URINE CULTURE    No results found.     ASSESSMENT & PLAN:  1. Cystitis     Meds ordered this encounter  Medications  . sulfamethoxazole-trimethoprim (BACTRIM DS,SEPTRA DS) 800-160 MG tablet    Sig: Take 1 tablet by mouth 2 (two) times daily.    Dispense:  14 tablet    Refill:  0    Reviewed expectations re: course of current medical issues. Questions answered. Outlined signs and symptoms indicating need for more acute intervention. Patient verbalized understanding. After Visit Summary given.    Procedures:      Elvina Sidle, MD 07/14/17 1359

## 2017-07-14 NOTE — ED Triage Notes (Signed)
PT reports urinary frequency, burning, and blood in urine. Started today.

## 2017-07-15 ENCOUNTER — Emergency Department (HOSPITAL_COMMUNITY): Payer: Self-pay

## 2017-07-15 ENCOUNTER — Encounter (HOSPITAL_COMMUNITY): Payer: Self-pay | Admitting: *Deleted

## 2017-07-15 ENCOUNTER — Emergency Department (HOSPITAL_COMMUNITY)
Admission: EM | Admit: 2017-07-15 | Discharge: 2017-07-16 | Disposition: A | Discharge status: Home / Self Care | Payer: Self-pay | Attending: Physician Assistant | Admitting: Physician Assistant

## 2017-07-15 DIAGNOSIS — R42 Dizziness and giddiness: Secondary | ICD-10-CM | POA: Insufficient documentation

## 2017-07-15 DIAGNOSIS — R300 Dysuria: Secondary | ICD-10-CM | POA: Insufficient documentation

## 2017-07-15 DIAGNOSIS — R51 Headache: Secondary | ICD-10-CM | POA: Insufficient documentation

## 2017-07-15 DIAGNOSIS — Z87891 Personal history of nicotine dependence: Secondary | ICD-10-CM | POA: Insufficient documentation

## 2017-07-15 DIAGNOSIS — R110 Nausea: Secondary | ICD-10-CM | POA: Insufficient documentation

## 2017-07-15 DIAGNOSIS — N3001 Acute cystitis with hematuria: Secondary | ICD-10-CM | POA: Insufficient documentation

## 2017-07-15 LAB — I-STAT TROPONIN, ED: Troponin i, poc: 0 ng/mL (ref 0.00–0.08)

## 2017-07-15 LAB — I-STAT CHEM 8, ED
BUN: 6 mg/dL (ref 6–20)
CHLORIDE: 104 mmol/L (ref 101–111)
Calcium, Ion: 1.1 mmol/L — ABNORMAL LOW (ref 1.15–1.40)
Creatinine, Ser: 0.7 mg/dL (ref 0.44–1.00)
Glucose, Bld: 113 mg/dL — ABNORMAL HIGH (ref 65–99)
HEMATOCRIT: 37 % (ref 36.0–46.0)
Hemoglobin: 12.6 g/dL (ref 12.0–15.0)
POTASSIUM: 4 mmol/L (ref 3.5–5.1)
SODIUM: 139 mmol/L (ref 135–145)
TCO2: 23 mmol/L (ref 22–32)

## 2017-07-15 MED ORDER — ALBUTEROL SULFATE HFA 108 (90 BASE) MCG/ACT IN AERS
2.0000 | INHALATION_SPRAY | Freq: Once | RESPIRATORY_TRACT | Status: AC
  Administered 2017-07-16: 2 via RESPIRATORY_TRACT
  Filled 2017-07-15: qty 6.7

## 2017-07-15 MED ORDER — ACETAMINOPHEN 500 MG PO TABS
1000.0000 mg | ORAL_TABLET | Freq: Once | ORAL | Status: AC
  Administered 2017-07-15: 1000 mg via ORAL
  Filled 2017-07-15: qty 2

## 2017-07-15 MED ORDER — KETOROLAC TROMETHAMINE 60 MG/2ML IM SOLN
30.0000 mg | Freq: Once | INTRAMUSCULAR | Status: AC
  Administered 2017-07-15: 30 mg via INTRAMUSCULAR
  Filled 2017-07-15: qty 2

## 2017-07-15 MED ORDER — CEPHALEXIN 500 MG PO CAPS: 500.0000 mg | ORAL_CAPSULE | Freq: Four times a day (QID) | ORAL | 0 refills | Status: AC

## 2017-07-15 MED ORDER — ONDANSETRON 4 MG PO TBDP
4.0000 mg | ORAL_TABLET | Freq: Once | ORAL | Status: AC
  Administered 2017-07-15: 4 mg via ORAL
  Filled 2017-07-15: qty 1

## 2017-07-15 MED ORDER — ONDANSETRON 4 MG PO TBDP: 4.0000 mg | ORAL_TABLET | Freq: Three times a day (TID) | ORAL | 0 refills | Status: DC | PRN

## 2017-07-15 NOTE — ED Provider Notes (Signed)
[facility] Provider Note   CSN: [id number] Arrival date & time: 07/15/17  1631     History   Chief Complaint Chief Complaint  Patient presents with  . Chest Pain  . Dizziness    HPI Melanie Valencia is a 21 y.o. female with h/o recurrent UTI and anxiety presents to ED for evaluation of sudden onset, intermittent shakiness, light-headedness, headache, CP, SOB ("have to take deeper breaths), nausea, gagging and lower abdominal pain onset this morning after she took first dose of bactrim prescribed to her for UTI yesterday. Dysuria improving since taking antibiotic. No fevers, flank or bank pain, vaginal bleeding or discharge. CP is diffuse, intermittent, non pleuritic, non exertional. Currently she does not have CP. States she wasn't sure if this was her anxiety or a reaction to bactrim. She laid down and tried to sleep it off but symptoms persisted and she came to ED. She is hungry. Other than remote h/o tobacco use, no history of DM, HTN, HLD, DVT/PE.   HPI  History reviewed. No pertinent past medical history.  There are no active problems to display for this patient.   History reviewed. No pertinent surgical history.  OB History    No data available       Home Medications    Prior to Admission medications   Medication Sig Start Date End Date Taking? Authorizing Provider  albuterol (PROVENTIL HFA;VENTOLIN HFA) 108 (90 Base) MCG/ACT inhaler Inhale 2 puffs into the lungs every 4 (four) hours as needed for wheezing or shortness of breath. Patient not taking: Reported on 07/15/2017 04/11/16   Deatra Canter, FNP  cephALEXin (KEFLEX) 500 MG capsule Take 1 capsule (500 mg total) by mouth 4 (four) times daily. 07/15/17 07/20/17  Liberty Handy, PA-C  famotidine (PEPCID) 40 MG tablet Take 1 tablet (40 mg total) by mouth 2 (two) times daily. Patient not taking: Reported on 07/15/2017 09/06/16 07/15/17  Eustace Moore, MD  ondansetron (ZOFRAN ODT) 4 MG disintegrating tablet  Take 1 tablet (4 mg total) by mouth every 8 (eight) hours as needed for nausea or vomiting. 07/15/17   Liberty Handy, PA-C    Family History No family history on file.  Social History Social History  Substance Use Topics  . Smoking status: Former Games developer  . Smokeless tobacco: Never Used  . Alcohol use Yes     Allergies   Patient has no known allergies.   Review of Systems Review of Systems  Constitutional: Negative for chills, diaphoresis and fever.  Gastrointestinal: Positive for abdominal pain and nausea. Negative for vomiting.  Genitourinary: Positive for difficulty urinating, dysuria and hematuria. Negative for flank pain.  Musculoskeletal: Negative for back pain.  Allergic/Immunologic: Negative for immunocompromised state.  Neurological: Positive for light-headedness and headaches.     Physical Exam Updated Vital Signs BP (!) 124/58   Pulse 92   Temp 98.5 F (36.9 C) (Oral)   Resp 14   LMP 07/07/2017   SpO2 99%   Physical Exam  Constitutional: She is oriented to person, place, and time. She appears well-developed and well-nourished. No distress.  NAD.  HENT:  Head: Normocephalic and atraumatic.  Right Ear: External ear normal.  Left Ear: External ear normal.  Nose: Nose normal.  Mouth/Throat: Oropharynx is clear and moist.  Eyes: Pupils are equal, round, and reactive to light. Conjunctivae and EOM are normal. No scleral icterus.  Neck: Normal range of motion. Neck supple.  Cardiovascular: Normal rate, regular rhythm and normal heart sounds.  No murmur heard. No S3, orthopnea or LE edema DP and radial pulses 2+ bilaterally  Pulmonary/Chest: Effort normal and breath sounds normal. She has no wheezes.  No chest wall tenderness  Abdominal: Soft. Bowel sounds are normal. There is no tenderness.  No suprapubic or CVA tenderness No epigastric tenderness  Musculoskeletal: Normal range of motion. She exhibits no deformity.  Neurological: She is alert and  oriented to person, place, and time.  Skin: Skin is warm and dry. Capillary refill takes less than 2 seconds.  Psychiatric: She has a normal mood and affect. Her behavior is normal. Judgment and thought content normal.  Nursing note and vitals reviewed.    ED Treatments / Results  Labs (all labs ordered are listed, but only abnormal results are displayed) Labs Reviewed  I-STAT CHEM 8, ED - Abnormal; Notable for the following:       Result Value   Glucose, Bld 113 (*)    Calcium, Ion 1.10 (*)    All other components within normal limits  I-STAT TROPONIN, ED    EKG  EKG Interpretation None       Radiology Dg Chest 2 View  Result Date: 07/15/2017 CLINICAL DATA:  Left chest pain EXAM: CHEST  2 VIEW COMPARISON:  None. FINDINGS: Lungs are clear.  No pleural effusion or pneumothorax. The heart is normal in size. Visualized osseous structures are within normal limits. IMPRESSION: Normal chest radiographs. Electronically Signed   By: Charline Bills M.D.   On: 07/15/2017 18:10    Procedures Procedures (including critical care time)  Medications Ordered in ED Medications  ondansetron (ZOFRAN-ODT) disintegrating tablet 4 mg (4 mg Oral Given 07/15/17 2230)  acetaminophen (TYLENOL) tablet 1,000 mg (1,000 mg Oral Given 07/15/17 2231)  ketorolac (TORADOL) injection 30 mg (30 mg Intramuscular Given 07/15/17 2311)     Initial Impression / Assessment and Plan / ED Course  I have reviewed the triage vital signs and the nursing notes.  Pertinent labs & imaging results that were available during my care of the patient were reviewed by me and considered in my medical decision making (see chart for details).   21 yo female with h/o anxiety and recurrent UTI presents to ED for evaluation of constellation of symptoms that started suddenly this morning soon after taking bactrim for diagnosed UTI yesterday at [facility]. Symptoms include shakiness, light-headedness, headache, non pleuritic non  exertional, CP, non exertional SOB ("have to take deeper breaths), nausea, gagging and lower abdominal pain. States she sometimes gets these symptoms with her anxiety; wasn't sure if it was anxiety or reaction to bactrim. Has taken bactrim in the past for UTIs. Denies increase in anxiety lately. Symptoms have improved in the waiting room. Endorses headache and nausea, all other symptoms have improved. She is asking if she can eat, she is hungry.   On exam, VS WNL. Non toxic. PE completely benign w/o suprapubic or CVA tenderness. Triage ordered CXR and EKG which are reassuring. Urine preg, trop x1 and chem 8 normal. She has no risk factors for ACS. Doubt cardiac emergency. No flank pain, fevers, chills to suggest pyelo. Pt was given NSAIDs and zofran, she tolerated PO without difficulty. I don't think pt needs further emergent lab work, imaging or admission. Will d/c and switch bactrim to keflex. Discussed return precautions.    Final Clinical Impressions(s) / ED Diagnoses   Final diagnoses:  Acute cystitis with hematuria  Lightheadedness  Nausea    New Prescriptions New Prescriptions   CEPHALEXIN (KEFLEX) 500  MG CAPSULE    Take 1 capsule (500 mg total) by mouth 4 (four) times daily.   ONDANSETRON (ZOFRAN ODT) 4 MG DISINTEGRATING TABLET    Take 1 tablet (4 mg total) by mouth every 8 (eight) hours as needed for nausea or vomiting.     Liberty Handy, PA-C 07/15/17 2335    Abelino Derrick, MD 07/19/17 [phone]

## 2017-07-15 NOTE — ED Triage Notes (Signed)
Pt states seen at urgent care and was placed on abx and she took a dose today and then got sick, shakey, dizzy, and chest pain

## 2017-07-15 NOTE — Discharge Instructions (Signed)
You presented to the emergency department for shakiness, lightheadedness, headache, shortness of breath, nausea, gagging, lower abdominal pain. Symptoms started after you took Bactrim for urinary tract infection.  Screening labs, chest x-ray EKG and heart enzymes are normal today. You are low risk for a cardiac emergency. Some of these symptoms may be from your anxiety or possibly from your body fighting urinary tract infection.  Take Zofran as needed for nausea.  Bactrim will be changed to Keflex to avoid adverse reactions. Drink enough water until your urine is clear.  Return to the emergency department for fevers, back pain, worsening discomfort with urination or other concerning  symptoms.

## 2017-07-16 LAB — URINE CULTURE

## 2017-07-16 NOTE — ED Notes (Signed)
Discharge instructions and prescriptions reviewed - voiced understanding 

## 2018-04-03 ENCOUNTER — Ambulatory Visit (HOSPITAL_COMMUNITY)
Admission: EM | Admit: 2018-04-03 | Discharge: 2018-04-03 | Disposition: A | Discharge status: Home / Self Care | Payer: 59 | Attending: Internal Medicine | Admitting: Internal Medicine

## 2018-04-03 ENCOUNTER — Encounter (HOSPITAL_COMMUNITY): Payer: Self-pay

## 2018-04-03 DIAGNOSIS — R3915 Urgency of urination: Secondary | ICD-10-CM | POA: Insufficient documentation

## 2018-04-03 DIAGNOSIS — Z3202 Encounter for pregnancy test, result negative: Secondary | ICD-10-CM

## 2018-04-03 DIAGNOSIS — Z87891 Personal history of nicotine dependence: Secondary | ICD-10-CM | POA: Insufficient documentation

## 2018-04-03 DIAGNOSIS — N898 Other specified noninflammatory disorders of vagina: Secondary | ICD-10-CM | POA: Diagnosis not present

## 2018-04-03 LAB — POCT URINALYSIS DIP (DEVICE)
Bilirubin Urine: NEGATIVE
GLUCOSE, UA: NEGATIVE mg/dL
Ketones, ur: NEGATIVE mg/dL
Leukocytes, UA: NEGATIVE
NITRITE: NEGATIVE
PROTEIN: NEGATIVE mg/dL
Specific Gravity, Urine: 1.02 (ref 1.005–1.030)
UROBILINOGEN UA: 0.2 mg/dL (ref 0.0–1.0)
pH: 7 (ref 5.0–8.0)

## 2018-04-03 LAB — POCT PREGNANCY, URINE: PREG TEST UR: NEGATIVE

## 2018-04-03 MED ORDER — FLUCONAZOLE 150 MG PO TABS: 150.0000 mg | ORAL_TABLET | Freq: Every day | ORAL | 0 refills | Status: DC

## 2018-04-03 MED ORDER — METRONIDAZOLE 500 MG PO TABS: 500.0000 mg | ORAL_TABLET | Freq: Two times a day (BID) | ORAL | 0 refills | Status: DC

## 2018-04-03 NOTE — ED Provider Notes (Signed)
[facility]    CSN: [id number] Arrival date & time: 04/03/18  1618     History   Chief Complaint Chief Complaint  Patient presents with  . Vaginal Discharge    HPI Melanie Valencia is a 22 y.o. female.   22 year old female comes in for 1.5 week history of vaginal discharge with foul odor. Denies vaginal itching/pain. Has some intermittent urinary urgency without frequency, dysuria, hematuria. States had intermittent abdominal pain with occasional nausea when she first woke up, so she took a pregnancy test, which as negative. Denies fever, chills, night sweats. Sexually active with 1 female partner, occasional condom use. LMP 03/18/2018. States had recently tried a new soap, which may have started the symptoms, states when she switched back to her normal soap, the foul odor seemed to have approved slightly.      History reviewed. No pertinent past medical history.  There are no active problems to display for this patient.   History reviewed. No pertinent surgical history.  OB History   None      Home Medications    Prior to Admission medications   Medication Sig Start Date End Date Taking? Authorizing Provider  fluconazole (DIFLUCAN) 150 MG tablet Take 1 tablet (150 mg total) by mouth daily. Take second dose 72 hours later if symptoms still persists. 04/03/18   Cathie Hoops, Amy V, PA-C  metroNIDAZOLE (FLAGYL) 500 MG tablet Take 1 tablet (500 mg total) by mouth 2 (two) times daily. 04/03/18   Belinda Fisher, PA-C    Family History No family history on file.  Social History Social History   Tobacco Use  . Smoking status: Former Games developer  . Smokeless tobacco: Never Used  Substance Use Topics  . Alcohol use: Yes  . Drug use: No     Allergies   Patient has no known allergies.   Review of Systems Review of Systems  Reason unable to perform ROS: See HPI as above.     Physical Exam Triage Vital Signs ED Triage Vitals [04/03/18 1656]  Enc Vitals Group     BP 131/69   Pulse Rate 91     Resp 18     Temp 98 F (36.7 C)     Temp src      SpO2 100 %     Weight      Height      Head Circumference      Peak Flow      Pain Score 3     Pain Loc      Pain Edu?      Excl. in GC?    No data found.  Updated Vital Signs BP 131/69   Pulse 91   Temp 98 F (36.7 C)   Resp 18   LMP 03/23/2018   SpO2 100%   Physical Exam  Constitutional: She is oriented to person, place, and time. She appears well-developed and well-nourished. No distress.  HENT:  Head: Normocephalic and atraumatic.  Eyes: Pupils are equal, round, and reactive to light. Conjunctivae are normal.  Cardiovascular: Normal rate, regular rhythm and normal heart sounds. Exam reveals no gallop and no friction rub.  No murmur heard. Pulmonary/Chest: Effort normal and breath sounds normal. She has no wheezes. She has no rales.  Abdominal: Soft. Bowel sounds are normal. She exhibits no mass. There is no tenderness. There is no rebound, no guarding and no CVA tenderness.  Neurological: She is alert and oriented to person, place, and time.  Skin: Skin is warm and dry.  Psychiatric: She has a normal mood and affect. Her behavior is normal. Judgment normal.     UC Treatments / Results  Labs (all labs ordered are listed, but only abnormal results are displayed) Labs Reviewed  POCT URINALYSIS DIP (DEVICE) - Abnormal; Notable for the following components:      Result Value   Hgb urine dipstick TRACE (*)    All other components within normal limits  POCT PREGNANCY, URINE  URINE CYTOLOGY ANCILLARY ONLY    EKG None  Radiology No results found.  Procedures Procedures (including critical care time)  Medications Ordered in UC Medications - No data to display  Initial Impression / Assessment and Plan / UC Course  I have reviewed the triage vital signs and the nursing notes.  Pertinent labs & imaging results that were available during my care of the patient were reviewed by me and  considered in my medical decision making (see chart for details).    Urine negative for UTI. Patient was treated empirically for yeast and BV. Start diflucan and flagyl as directed. Cytology sent, patient will be contacted with any positive results that require additional treatment. Patient to refrain from sexual activity for the next 7 days. Return precautions given.   Final Clinical Impressions(s) / UC Diagnoses   Final diagnoses:  Vaginal discharge    ED Prescriptions    Medication Sig Dispense Auth. Provider   metroNIDAZOLE (FLAGYL) 500 MG tablet Take 1 tablet (500 mg total) by mouth 2 (two) times daily. 14 tablet Yu, Amy V, PA-C   fluconazole (DIFLUCAN) 150 MG tablet Take 1 tablet (150 mg total) by mouth daily. Take second dose 72 hours later if symptoms still persists. 2 tablet Threasa AlphaYu, Amy V, PA-C        Yu, Amy V, PA-C 04/03/18 1816

## 2018-04-03 NOTE — Discharge Instructions (Signed)
Urine was negative for urinary tract infection. You were treated empirically for bacterial vaginitis and yeast. Start flagyl and diflucan as directed. Cytology sent, you will be contacted with any positive results that requires further treatment. Refrain from sexual activity and alcohol use for the next 7 days. Monitor for any worsening of symptoms, fever, abdominal pain, nausea, vomiting, to follow up for reevaluation.

## 2018-04-03 NOTE — ED Triage Notes (Signed)
Pt presents with complaints of vaginal discharge with a foul odor.

## 2018-04-05 ENCOUNTER — Telehealth (HOSPITAL_COMMUNITY): Payer: Self-pay

## 2018-04-05 LAB — URINE CYTOLOGY ANCILLARY ONLY
Chlamydia: POSITIVE — AB
NEISSERIA GONORRHEA: NEGATIVE
Trichomonas: NEGATIVE

## 2018-04-05 MED ORDER — AZITHROMYCIN 250 MG PO TABS: 1000.0000 mg | ORAL_TABLET | Freq: Every day | ORAL | 0 refills | Status: AC

## 2018-04-05 NOTE — Telephone Encounter (Signed)
Chlamydia is positive.  Rx po zithromax 1g #1 dose no refills was sent to the pharmacy of record.  Pt contacted and made aware, educated to please refrain from sexual intercourse for 7 days to give the medicine time to work, sexual partners need to be notified and tested/treated.  Condoms may reduce risk of reinfection.  Recheck or followup with PCP for further evaluation if symptoms are not improving.   [facility] notified  

## 2018-04-09 ENCOUNTER — Telehealth (HOSPITAL_COMMUNITY): Payer: Self-pay

## 2018-04-09 LAB — URINE CYTOLOGY ANCILLARY ONLY: CANDIDA VAGINITIS: NEGATIVE

## 2018-04-09 NOTE — Telephone Encounter (Signed)
Bacterial Vaginosis test is positive.  Prescription for metronidazole was given at the urgent care visit. Attempted to reach patient. No answer at this time. 

## 2018-12-13 ENCOUNTER — Emergency Department (HOSPITAL_COMMUNITY)
Admission: EM | Admit: 2018-12-13 | Discharge: 2018-12-13 | Disposition: A | Discharge status: Home / Self Care | Payer: 59 | Attending: Emergency Medicine | Admitting: Emergency Medicine

## 2018-12-13 ENCOUNTER — Emergency Department (HOSPITAL_COMMUNITY): Payer: 59

## 2018-12-13 ENCOUNTER — Encounter: Payer: Self-pay | Admitting: Emergency Medicine

## 2018-12-13 DIAGNOSIS — Z87891 Personal history of nicotine dependence: Secondary | ICD-10-CM | POA: Insufficient documentation

## 2018-12-13 DIAGNOSIS — R197 Diarrhea, unspecified: Secondary | ICD-10-CM | POA: Insufficient documentation

## 2018-12-13 DIAGNOSIS — R112 Nausea with vomiting, unspecified: Secondary | ICD-10-CM | POA: Diagnosis not present

## 2018-12-13 DIAGNOSIS — Z79899 Other long term (current) drug therapy: Secondary | ICD-10-CM | POA: Insufficient documentation

## 2018-12-13 LAB — COMPREHENSIVE METABOLIC PANEL
ALK PHOS: 81 U/L (ref 38–126)
ALT: 17 U/L (ref 0–44)
AST: 16 U/L (ref 15–41)
Albumin: 4.1 g/dL (ref 3.5–5.0)
Anion gap: 9 (ref 5–15)
BILIRUBIN TOTAL: 0.4 mg/dL (ref 0.3–1.2)
BUN: 7 mg/dL (ref 6–20)
CALCIUM: 9 mg/dL (ref 8.9–10.3)
CO2: 25 mmol/L (ref 22–32)
CREATININE: 0.63 mg/dL (ref 0.44–1.00)
Chloride: 104 mmol/L (ref 98–111)
GFR calc Af Amer: >60 mL/min (ref >60)
Glucose, Bld: 121 mg/dL — ABNORMAL HIGH (ref 70–99)
Potassium: 3.9 mmol/L (ref 3.5–5.1)
Sodium: 138 mmol/L (ref 135–145)
Total Protein: 7.8 g/dL (ref 6.5–8.1)

## 2018-12-13 LAB — URINALYSIS, ROUTINE W REFLEX MICROSCOPIC
BILIRUBIN URINE: NEGATIVE
Glucose, UA: NEGATIVE mg/dL
KETONES UR: NEGATIVE mg/dL
LEUKOCYTE UA: NEGATIVE
NITRITE: NEGATIVE
PROTEIN: NEGATIVE mg/dL
Specific Gravity, Urine: 1.026 (ref 1.005–1.030)
pH: 5 (ref 5.0–8.0)

## 2018-12-13 LAB — WET PREP, GENITAL
Clue Cells Wet Prep HPF POC: NONE SEEN
Sperm: NONE SEEN
Trich, Wet Prep: NONE SEEN
WBC WET PREP: NONE SEEN
YEAST WET PREP: NONE SEEN

## 2018-12-13 LAB — CBC
HCT: 44.4 % (ref 36.0–46.0)
Hemoglobin: 13.9 g/dL (ref 12.0–15.0)
MCH: 27.7 pg (ref 26.0–34.0)
MCHC: 31.3 g/dL (ref 30.0–36.0)
MCV: 88.4 fL (ref 80.0–100.0)
NRBC: 0 % (ref 0.0–0.2)
PLATELETS: 258 10*3/uL (ref 150–400)
RBC: 5.02 MIL/uL (ref 3.87–5.11)
RDW: 13.2 % (ref 11.5–15.5)
WBC: 7.6 10*3/uL (ref 4.0–10.5)

## 2018-12-13 LAB — POC URINE PREG, ED: PREG TEST UR: POSITIVE — AB

## 2018-12-13 LAB — HCG, QUANTITATIVE, PREGNANCY: hCG, Beta Chain, Quant, S: 513 m[IU]/mL — ABNORMAL HIGH (ref <5)

## 2018-12-13 LAB — LIPASE, BLOOD: Lipase: 24 U/L (ref 11–51)

## 2018-12-13 MED ORDER — SODIUM CHLORIDE 0.9% FLUSH
3.0000 mL | Freq: Once | INTRAVENOUS | Status: AC
  Administered 2018-12-13: 3 mL via INTRAVENOUS

## 2018-12-13 MED ORDER — SODIUM CHLORIDE 0.9 % IV BOLUS
500.0000 mL | Freq: Once | INTRAVENOUS | Status: AC
  Administered 2018-12-13: 500 mL via INTRAVENOUS

## 2018-12-13 MED ORDER — ONDANSETRON 4 MG PO TBDP
4.0000 mg | ORAL_TABLET | Freq: Once | ORAL | Status: AC | PRN
  Administered 2018-12-13: 4 mg via ORAL
  Filled 2018-12-13: qty 1

## 2018-12-13 MED ORDER — DOXYLAMINE-PYRIDOXINE 10-10 MG PO TBEC: 1.0000 | DELAYED_RELEASE_TABLET | Freq: Two times a day (BID) | ORAL | 0 refills | Status: DC

## 2018-12-13 NOTE — Discharge Instructions (Addendum)
You have been seen today for nausea, vomiting, diarrhea. Please read and follow all provided instructions.   1. Medications: Diclegis for nausea/vomiting, usual home medications 2. Treatment: rest, drink plenty of fluids 3. Follow Up: Please have your hCG levels rechecked in 48 hours. Please follow up with your primary doctor in 2 days for discussion of your diagnoses and further evaluation after today's visit; if you do not have a primary care doctor use the resource guide provided to find one; Please return to the ER for any new or worsening symptoms. Please obtain all of your results from medical records or have your doctors office obtain the results - share them with your doctor - you should be seen at your doctors office. Call today to arrange your follow up.   Take medications as prescribed. Please review all of the medicines and only take them if you do not have an allergy to them. Return to the emergency room for worsening condition or new concerning symptoms. Follow up with your regular doctor. If you don't have a regular doctor use one of the numbers below to establish a primary care doctor.  Please be aware that if you are taking birth control pills, taking other prescriptions, ESPECIALLY ANTIBIOTICS may make the birth control ineffective - if this is the case, either do not engage in sexual activity or use alternative methods of birth control such as condoms until you have finished the medicine and your family doctor says it is OK to restart them. If you are on a blood thinner such as COUMADIN, be aware that any other medicine that you take may cause the coumadin to either work too much, or not enough - you should have your coumadin level rechecked in next 7 days if this is the case.  ?  It is also a possibility that you have an allergic reaction to any of the medicines that you have been prescribed - Everybody reacts differently to medications and while MOST people have no trouble with most  medicines, you may have a reaction such as nausea, vomiting, rash, swelling, shortness of breath. If this is the case, please stop taking the medicine immediately and contact your physician.  ?  You should return to the ER if you develop severe or worsening symptoms.   Emergency Department Resource Guide 1) Find a Doctor and Pay Out of Pocket Although you won't have to find out who is covered by your insurance plan, it is a good idea to ask around and get recommendations. You will then need to call the office and see if the doctor you have chosen will accept you as a new patient and what types of options they offer for patients who are self-pay. Some doctors offer discounts or will set up payment plans for their patients who do not have insurance, but you will need to ask so you aren't surprised when you get to your appointment.  2) Contact Your Local Health Department Not all health departments have doctors that can see patients for sick visits, but many do, so it is worth a call to see if yours does. If you don't know where your local health department is, you can check in your phone book. The CDC also has a tool to help you locate your state's health department, and many state websites also have listings of all of their local health departments.  3) Find a Walk-in Clinic If your illness is not likely to be very severe or complicated, you may  want to try a walk in clinic. These are popping up all over the country in pharmacies, drugstores, and shopping centers. They're usually staffed by nurse practitioners or physician assistants that have been trained to treat common illnesses and complaints. They're usually fairly quick and inexpensive. However, if you have serious medical issues or chronic medical problems, these are probably not your best option.  No Primary Care Doctor: Call Health Connect at  [phone] - they can help you locate a primary care doctor that  accepts your insurance, provides  certain services, etc. Physician Referral [phone]  Emergency Department Resource Guide 1) Find a Doctor and Pay Out of Pocket Although you won't have to find out who is covered by your insurance plan, it is a good idea to ask around and get recommendations. You will then need to call the office and see if the doctor you have chosen will accept you as a new patient and what types of options they offer for patients who are self-pay. Some doctors offer discounts or will set up payment plans for their patients who do not have insurance, but you will need to ask so you aren't surprised when you get to your appointment.  2) Contact Your Local Health Department Not all health departments have doctors that can see patients for sick visits, but many do, so it is worth a call to see if yours does. If you don't know where your local health department is, you can check in your phone book. The CDC also has a tool to help you locate your state's health department, and many state websites also have listings of all of their local health departments.  3) Find a [facility] If your illness is not likely to be very severe or complicated, you may want to try a walk in clinic. These are popping up all over the country in pharmacies, drugstores, and shopping centers. They're usually staffed by nurse practitioners or physician assistants that have been trained to treat common illnesses and complaints. They're usually fairly quick and inexpensive. However, if you have serious medical issues or chronic medical problems, these are probably not your best option.  No Primary Care Doctor: Call Health Connect at  [phone] - they can help you locate a primary care doctor that  accepts your insurance, provides certain services, etc. Physician Referral Service- [phone]  Chronic Pain Problems: Organization         Address  Phone   Notes  [facility]  [phone] Patients need to be  referred by their primary care doctor.   Medication Assistance: Organization         Address  Phone   Notes  [facility] Med[facility] [address] [place], [place] [address] [phone] --Must be a resident of [facility] -- Must have NO insurance coverage whatsoever (no Medicaid/ Medicare, etc.) -- The pt. MUST have a primary care doctor that directs their care regularly and follows them in the community   [facility]  [phone]   [employer]  [phone]    Agencies that provide inexpensive medical care: Organization         Address  Phone   Notes  [facility]  [phone]   [facility]    [phone]   [facility] [place], [place] [address] [phone]   [place] [phone])  [place]    [phone]   [facility]    [phone]   [facility] [address], [place] Phone:  [phone], Fax:  [phone] Hours of Operation:  9 am - 6 pm, M-F.  Also accepts Medicaid/Medicare and self-pay.  [facility] for [place], [address], [place] Phone: [phone], Fax: [phone]. Hours of Operation:  8:30 am - 5:30 pm, M-F.  Also accepts Medicaid and self-pay.  [facility] [place] [address], [place] Phone: [phone]   [place], [place], [place] [phone], Ext. [phone] Mondays & Thursdays: 7-9 AM.  First 15 patients are seen on a first come, first serve basis.    [place] Providers:  Organization         Address  Phone   Notes  [facility] [address], [place] [phone] Also accepts self-pay patients.  [facility] [address], [place]  [phone]   [place], [address], [place] [phone]   [facility] [address], [place] [phone]   Lucianne Lei [address], [place]   [phone] Only accepts [place] patients after they have their name applied to their card.   Self-Pay (no insurance) in [facility]:  Organization         Address  Phone   Notes  Sickle Cell Patients, [facility] [place] [phone]   [facility] [place] [phone]   [facility] [place]  [place], [place], [place] [phone]   [facility]/Dr. Osei-Bonsu  [address], [place] or [address], [place] [phone] Phone number for both [place] and [place] locations is the same.  [facility] [address], [place] [phone]   [facility] [address], [place] or [address] [phone]   [facility] [address], [place] [phone], phone; [phone], fax Sees patients 1st and 3rd Saturday of every month.  Must not qualify for public or private insurance (i.e. Medicaid, Medicare,  Health Choice, Veterans' Benefits)  Household income should be no more than 200% of the poverty level The clinic cannot treat you if you are pregnant or think you are pregnant  Sexually transmitted diseases are not treated at the clinic.

## 2018-12-13 NOTE — ED Provider Notes (Signed)
[facility] Note   CSN: [id number] Arrival date & time: 12/13/18  1239    History   Chief Complaint Chief Complaint  Patient presents with  . Emesis  . Abdominal Pain    HPI Melanie Valencia is a 23 y.o. female G1P0 with no significant past medical history presenting with nausea, vomiting, and diarrhea onset this morning at 4am. Patient reports she found out she was pregnant [redacted] week ago. LMP on 11/06/18. Patient reports intermittent periumbilical sharp abdominal pain. Patient denies abdominal pain currently. Patient states she tried eating bread and drinking water without relief. Patient reports multiple episodes of vomiting and diarrhea. Patient reports last episode was at 12pm today. Patient reports she had a urinary tract infection and recently finished antibiotics. Patient reports vaginal discharge and odor. Patient states she had Chlamydia a few months ago, but was treated. Patient states she was tested for STIs last week and requests not to be tested for STIs today. Patient denies sexual activity since last week. Patient denies vaginal bleeding. Patient reports sick contacts with similar symptoms by her niece and her niece's son. Patient states she has not seen OBGYN yet. Patient denies fever, cough, congestion, sore throat, headache, shortness of breath, or chest pain. Patient denies alcohol, tobacco, or drug use.      HPI  History reviewed. No pertinent past medical history.  There are no active problems to display for this patient.   History reviewed. No pertinent surgical history.   OB History    Gravida  1   Para      Term      Preterm      AB      Living        SAB      TAB      Ectopic      Multiple      Live Births               Home Medications    Prior to Admission medications   Medication Sig Start Date End Date Taking? Authorizing Provider  Doxylamine-Pyridoxine (DICLEGIS) 10-10 MG TBEC Take 1  tablet by mouth 2 (two) times daily. 12/13/18   Carlyle BasquesHernandez, Donzella Carrol P, PA-C  fluconazole (DIFLUCAN) 150 MG tablet Take 1 tablet (150 mg total) by mouth daily. Take second dose 72 hours later if symptoms still persists. 04/03/18   Cathie HoopsYu, Amy V, PA-C  metroNIDAZOLE (FLAGYL) 500 MG tablet Take 1 tablet (500 mg total) by mouth 2 (two) times daily. 04/03/18   Belinda FisherYu, Amy V, PA-C    Family History No family history on file.  Social History Social History   Tobacco Use  . Smoking status: Former Games developermoker  . Smokeless tobacco: Never Used  Substance Use Topics  . Alcohol use: Yes  . Drug use: No     Allergies   Patient has no known allergies.   Review of Systems Review of Systems  Constitutional: Negative for activity change, appetite change, chills, fever and unexpected weight change.  HENT: Negative for congestion, rhinorrhea and sore throat.   Eyes: Negative for visual disturbance.  Respiratory: Negative for cough and shortness of breath.   Cardiovascular: Negative for chest pain.  Gastrointestinal: Positive for abdominal pain, diarrhea, nausea and vomiting. Negative for constipation.  Endocrine: Negative for polydipsia, polyphagia and polyuria.  Genitourinary: Positive for vaginal discharge. Negative for dysuria, flank pain, frequency, urgency, vaginal bleeding and vaginal pain.  Musculoskeletal: Negative for back pain.  Skin: Negative  for rash.  Allergic/Immunologic: Negative for immunocompromised state.  Psychiatric/Behavioral: The patient is not nervous/anxious.      Physical Exam Updated Vital Signs BP 120/76 (BP Location: Left Arm)   Pulse 100   Temp 98.5 F (36.9 C) (Oral)   Resp 16   LMP 11/06/2018 (Exact Date)   SpO2 100%   Physical Exam Vitals signs and nursing note reviewed. Exam conducted with a chaperone present.  Constitutional:      General: She is not in acute distress.    Appearance: She is well-developed. She is not diaphoretic.  HENT:     Head: Normocephalic and  atraumatic.     Mouth/Throat:     Mouth: Mucous membranes are moist.     Pharynx: No oropharyngeal exudate.  Eyes:     Extraocular Movements: Extraocular movements intact.     Pupils: Pupils are equal, round, and reactive to light.  Neck:     Musculoskeletal: Normal range of motion and neck supple.  Cardiovascular:     Rate and Rhythm: Normal rate and regular rhythm.     Heart sounds: Normal heart sounds. No murmur. No friction rub. No gallop.   Pulmonary:     Effort: Pulmonary effort is normal. No respiratory distress.     Breath sounds: Normal breath sounds. No wheezing or rales.  Abdominal:     General: Bowel sounds are normal. There is no distension.     Palpations: Abdomen is soft. Abdomen is not rigid. There is no mass.     Tenderness: There is no abdominal tenderness. There is no right CVA tenderness, left CVA tenderness, guarding or rebound. Negative signs include Murphy's sign and McBurney's sign.     Hernia: No hernia is present.  Genitourinary:    Vagina: Vaginal discharge (White thick discharge noted. ) present. No erythema or bleeding.     Cervix: Discharge (White thick discharge noted.) present. No erythema or cervical bleeding.     Uterus: Normal.      Adnexa: Right adnexa normal and left adnexa normal.  Musculoskeletal: Normal range of motion.  Skin:    General: Skin is warm.     Findings: No rash.  Neurological:     Mental Status: She is alert and oriented to person, place, and time.     ED Treatments / Results  Labs (all labs ordered are listed, but only abnormal results are displayed) Labs Reviewed  COMPREHENSIVE METABOLIC PANEL - Abnormal; Notable for the following components:      Result Value   Glucose, Bld 121 (*)    All other components within normal limits  URINALYSIS, ROUTINE W REFLEX MICROSCOPIC - Abnormal; Notable for the following components:   APPearance HAZY (*)    Hgb urine dipstick SMALL (*)    Bacteria, UA RARE (*)    All other  components within normal limits  HCG, QUANTITATIVE, PREGNANCY - Abnormal; Notable for the following components:   hCG, Beta Chain, Quant, S 513 (*)    All other components within normal limits  POC URINE PREG, ED - Abnormal; Notable for the following components:   Preg Test, Ur POSITIVE (*)    All other components within normal limits  WET PREP, GENITAL  LIPASE, BLOOD  CBC    EKG None  Radiology US Ob Transvaginal  Result Date: 12/13/2018 CLINICAL DATA:  Pelvic pain for 2 weeks.  Positive pregnancy test. EXAM: OBSTETRIC <14 WK [place] AND TRANSVAGINAL OB US TECHNIQUE: Both transabdominal and transvaginal ultrasound examinations were performed for  complete evaluation of the gestation as well as the maternal uterus, adnexal regions, and pelvic cul-de-sac. Transvaginal technique was performed to assess early pregnancy. COMPARISON:  None. FINDINGS: Intrauterine gestational sac: None Yolk sac:  N/A Embryo:  N/A Cardiac Activity: N/A Heart Rate: N/A bpm Subchorionic hemorrhage:  None visualized. Maternal uterus/adnexae: Both ovaries are normal. The endometrium is normal thickness measuring 7.5 mm. IMPRESSION: No intrauterine pregnancy is identified and no adnexal mass. Both ovaries appear normal. Electronically Signed   By: Rudie Meyer M.D.   On: 12/13/2018 19:06    Procedures Procedures (including critical care time)  Medications Ordered in ED Medications  sodium chloride flush (NS) 0.9 % injection 3 mL (3 mLs Intravenous Given 12/13/18 1606)  ondansetron (ZOFRAN-ODT) disintegrating tablet 4 mg (4 mg Oral Given 12/13/18 1309)  sodium chloride 0.9 % bolus 500 mL (0 mLs Intravenous Stopped 12/13/18 1710)     Initial Impression / Assessment and Plan / ED Course  I have reviewed the triage vital signs and the nursing notes.  Pertinent labs & imaging results that were available during my care of the patient were reviewed by me and considered in my medical decision making (see chart for  details).  Clinical Course as of Dec 13 2016  Mon Dec 13, 2018  1927 No intrauterine pregnancy is identified and no adnexal mass. Both ovaries appear normal.     [AH]  1927 Beta HCG is 513. Will recommend patient have another beta HCG in 48 hours.  HCG, Beta Chain, Quant, S(!): 513 [AH]    Clinical Course User Index [AH] Leretha Dykes, PA-C      Patient is nontoxic, nonseptic appearing, in no apparent distress.  Patient's pain and other symptoms adequately managed in emergency department.  Fluid bolus given.  Labs, imaging and vitals reviewed.  Patient does not meet the SIRS or Sepsis criteria.  On repeat exam patient does not have a surgical abdomin and there are no peritoneal signs.  No indication of appendicitis, bowel obstruction, bowel perforation, cholecystitis, or diverticulitis. Beta hCG was 513. Advised patient to follow up in 48 hours with OBGYN to have levels rechecked. Ultrasound was not able to identify an intrauterine pregnacy or an adnexal mass. Patient has not had vaginal bleeding. All symptoms have resolved while in the ER. Patient discharged home with symptomatic treatment and given strict instructions for follow-up with their primary care physician and OBGYN.  I have also discussed reasons to return immediately to the ER.  Patient expresses understanding and agrees with plan.  Findings and plan of care discussed with supervising physician Dr. Jacqulyn Bath.  Final Clinical Impressions(s) / ED Diagnoses   Final diagnoses:  Nausea vomiting and diarrhea    ED Discharge Orders         Ordered    Doxylamine-Pyridoxine (DICLEGIS) 10-10 MG TBEC  2 times daily     12/13/18 2014           Leretha Dykes, [place] 12/13/18 2019    Maia Plan, MD 12/14/18 [phone]

## 2018-12-13 NOTE — ED Notes (Signed)
Patient Alert and oriented to baseline. Stable and ambulatory to baseline. Patient verbalized understanding of the discharge instructions.  Patient belongings were taken by the patient.   

## 2018-12-13 NOTE — ED Notes (Signed)
Patient transported to Ultrasound 

## 2018-12-13 NOTE — ED Triage Notes (Signed)
Pt presents for evaluation of N/V with abd cramping. Pt is around [redacted] weeks pregnant. Last period was 11/06/18. No vaginal bleeding.

## 2018-12-22 ENCOUNTER — Inpatient Hospital Stay (HOSPITAL_COMMUNITY): Payer: 59

## 2018-12-22 ENCOUNTER — Inpatient Hospital Stay (HOSPITAL_COMMUNITY)
Admission: EM | Admit: 2018-12-22 | Discharge: 2018-12-22 | Disposition: A | Discharge status: Home / Self Care | Payer: 59 | Attending: Obstetrics and Gynecology | Admitting: Obstetrics and Gynecology

## 2018-12-22 ENCOUNTER — Other Ambulatory Visit: Payer: Self-pay | Admitting: Advanced Practice Midwife

## 2018-12-22 ENCOUNTER — Encounter (HOSPITAL_COMMUNITY): Payer: Self-pay

## 2018-12-22 DIAGNOSIS — O039 Complete or unspecified spontaneous abortion without complication: Secondary | ICD-10-CM

## 2018-12-22 DIAGNOSIS — O209 Hemorrhage in early pregnancy, unspecified: Secondary | ICD-10-CM | POA: Diagnosis present

## 2018-12-22 DIAGNOSIS — O4690 Antepartum hemorrhage, unspecified, unspecified trimester: Secondary | ICD-10-CM

## 2018-12-22 DIAGNOSIS — Z3A08 8 weeks gestation of pregnancy: Secondary | ICD-10-CM

## 2018-12-22 DIAGNOSIS — Z3A01 Less than 8 weeks gestation of pregnancy: Secondary | ICD-10-CM | POA: Insufficient documentation

## 2018-12-22 DIAGNOSIS — Z87891 Personal history of nicotine dependence: Secondary | ICD-10-CM | POA: Insufficient documentation

## 2018-12-22 LAB — HCG, QUANTITATIVE, PREGNANCY: hCG, Beta Chain, Quant, S: 79 m[IU]/mL — ABNORMAL HIGH (ref <5)

## 2018-12-22 LAB — CBC
HCT: 35.9 % — ABNORMAL LOW (ref 36.0–46.0)
Hemoglobin: 11.9 g/dL — ABNORMAL LOW (ref 12.0–15.0)
MCH: 28.4 pg (ref 26.0–34.0)
MCHC: 33.1 g/dL (ref 30.0–36.0)
MCV: 85.7 fL (ref 80.0–100.0)
Platelets: 258 10*3/uL (ref 150–400)
RBC: 4.19 MIL/uL (ref 3.87–5.11)
RDW: 13.4 % (ref 11.5–15.5)
WBC: 7.9 10*3/uL (ref 4.0–10.5)
nRBC: 0 % (ref 0.0–0.2)

## 2018-12-22 LAB — ABO/RH: ABO/RH(D): B NEG

## 2018-12-22 MED ORDER — RHO D IMMUNE GLOBULIN 1500 UNIT/2ML IJ SOSY
300.0000 ug | PREFILLED_SYRINGE | Freq: Once | INTRAMUSCULAR | Status: AC
  Administered 2018-12-22: 300 ug via INTRAMUSCULAR
  Filled 2018-12-22: qty 2

## 2018-12-22 NOTE — Progress Notes (Signed)
Repeat quant no later than 12/29/2018  Clayton Bibles, CNM 12/22/18 11:53 PM

## 2018-12-22 NOTE — MAU Note (Signed)
Patient's bleeding from 2/17 resolved and then she started having dark red bleeding again around 1900 tonight.  No clots.  Reports having lower abdominal pain that started 20 mins after the bleeding.  Hasn't taken anything for the pain.  [facility] appointment scheduled for this Friday.

## 2018-12-22 NOTE — MAU Provider Note (Addendum)
History     CSN: [id number]  Arrival date and time: 12/22/18 2022   None     Chief Complaint  Patient presents with  . Vaginal Bleeding   Ms. Melanie Valencia is a 23 y.o. G1P0 at [redacted]w[redacted]d who presents to MAU for vaginal bleeding which began at 7pm tonight. Denies soaking 2 or more pads in 1hr. Pt reports "swollen glands" in right inguinal area with pain since 12/13/2018. Was told she had an infection and was treated with ABX and resolved, but pt still feels like it is there.  Passing blood clots? no Blood soaking clothes? no Lightheaded/dizzy? no Significant pelvic pain or cramping? mild cramping only, not currently cramping Passed any tissue? no Hx of PID, GYN surgery? no Current IUD? no   OB History    Gravida  1   Para      Term      Preterm      AB      Living        SAB      TAB      Ectopic      Multiple      Live Births              History reviewed. No pertinent past medical history.  History reviewed. No pertinent surgical history.  No family history on file.  Social History   Tobacco Use  . Smoking status: Former Games developer  . Smokeless tobacco: Never Used  Substance Use Topics  . Alcohol use: Yes  . Drug use: No    Allergies: No Known Allergies  Medications Prior to Admission  Medication Sig Dispense Refill Last Dose  . Doxylamine-Pyridoxine (DICLEGIS) 10-10 MG TBEC Take 1 tablet by mouth 2 (two) times daily. 30 tablet 0   . fluconazole (DIFLUCAN) 150 MG tablet Take 1 tablet (150 mg total) by mouth daily. Take second dose 72 hours later if symptoms still persists. 2 tablet 0   . metroNIDAZOLE (FLAGYL) 500 MG tablet Take 1 tablet (500 mg total) by mouth 2 (two) times daily. 14 tablet 0     Review of Systems  Constitutional: Negative for chills, diaphoresis and fever.  Gastrointestinal: Negative for abdominal pain, diarrhea, nausea and vomiting.  Genitourinary: Positive for pelvic pain and vaginal bleeding.  Neurological: Negative for  dizziness and light-headedness.   Physical Exam   Blood pressure 132/78, pulse 99, temperature 99.3 F (37.4 C), resp. rate 19, height 5\' 3"  (1.6 m), weight 80.7 kg, last menstrual period 11/06/2018, SpO2 99 %.  Physical Exam  Constitutional: She is oriented to person, place, and time. She appears well-developed and well-nourished. No distress.  GI: Soft. She exhibits no distension and no mass. There is no abdominal tenderness. There is no rebound and no guarding.  Genitourinary:    Uterus normal.  Uterus is not enlarged and not tender. Cervix exhibits no motion tenderness. Right adnexum displays no mass, no tenderness and no fullness. Left adnexum displays no mass, no tenderness and no fullness.    Genitourinary Comments: Active bleeding from external os on exam. No tissue present at os or in vaginal vault.     Neurological: She is alert and oriented to person, place, and time.  Skin: Skin is warm and dry. She is not diaphoretic.  Psychiatric: She has a normal mood and affect. Her behavior is normal.   No results found for this or any previous visit (from the past 24 hour(s)).  US Ob Comp < 14 Wks  Result Date: 12/13/2018 CLINICAL DATA:  Pelvic pain for 2 weeks.  Positive pregnancy test. EXAM: OBSTETRIC <14 WK [place] AND TRANSVAGINAL OB US TECHNIQUE: Both transabdominal and transvaginal ultrasound examinations were performed for complete evaluation of the gestation as well as the maternal uterus, adnexal regions, and pelvic cul-de-sac. Transvaginal technique was performed to assess early pregnancy. COMPARISON:  None. FINDINGS: Intrauterine gestational sac: None Yolk sac:  N/A Embryo:  N/A Cardiac Activity: N/A Heart Rate: N/A bpm Subchorionic hemorrhage:  None visualized. Maternal uterus/adnexae: Both ovaries are normal. The endometrium is normal thickness measuring 7.5 mm. IMPRESSION: No intrauterine pregnancy is identified and no adnexal mass. Both ovaries appear normal. Electronically Signed    By: Rudie Meyer M.D.   On: 12/13/2018 19:06   US Ob Transvaginal  Result Date: 12/13/2018 CLINICAL DATA:  Pelvic pain for 2 weeks.  Positive pregnancy test. EXAM: OBSTETRIC <14 WK [place] AND TRANSVAGINAL OB US TECHNIQUE: Both transabdominal and transvaginal ultrasound examinations were performed for complete evaluation of the gestation as well as the maternal uterus, adnexal regions, and pelvic cul-de-sac. Transvaginal technique was performed to assess early pregnancy. COMPARISON:  None. FINDINGS: Intrauterine gestational sac: None Yolk sac:  N/A Embryo:  N/A Cardiac Activity: N/A Heart Rate: N/A bpm Subchorionic hemorrhage:  None visualized. Maternal uterus/adnexae: Both ovaries are normal. The endometrium is normal thickness measuring 7.5 mm. IMPRESSION: No intrauterine pregnancy is identified and no adnexal mass. Both ovaries appear normal. Electronically Signed   By: Rudie Meyer M.D.   On: 12/13/2018 19:06    MAU Course  Procedures  MDM Labs & [place] ordered. Transfer of care given to [facility]. Marylen Ponto, NP 9:53 PM  Orders Placed This Encounter  Procedures  . US OB Transvaginal    Standing Status:   Standing    Number of Occurrences:   1    Order Specific Question:   Symptom/Reason for Exam    Answer:   Vaginal bleeding in pregnancy [[id number]]  . Urinalysis, Routine w reflex microscopic    Standing Status:   Standing    Number of Occurrences:   1  . CBC    Standing Status:   Standing    Number of Occurrences:   1  . hCG, quantitative, pregnancy    Standing Status:   Standing    Number of Occurrences:   1  . ABO/Rh    Standing Status:   Standing    Number of Occurrences:   1   No orders of the defined types were placed in this encounter.   Care assumed from N. Nugent, BP 12/22/2018 at 2153~SCW   Results for orders placed or performed during the hospital encounter of 12/22/18 (from the past 24 hour(s))  CBC     Status: Abnormal   Collection Time: 12/22/18  9:47 PM   Result Value Ref Range   WBC 7.9 4.0 - 10.5 K/uL   RBC 4.19 3.87 - 5.11 MIL/uL   Hemoglobin 11.9 (L) 12.0 - 15.0 g/dL   HCT 28.4 (L) 13.2 - 44.0 %   MCV 85.7 80.0 - 100.0 fL   MCH 28.4 26.0 - 34.0 pg   MCHC 33.1 30.0 - 36.0 g/dL   RDW 10.2 72.5 - 36.6 %   Platelets 258 150 - 400 K/uL   nRBC 0.0 0.0 - 0.2 %  hCG, quantitative, pregnancy     Status: Abnormal   Collection Time: 12/22/18  9:47 PM  Result Value Ref Range   hCG, Beta Chain, Quant,  S 79 (H) <5 mIU/mL  ABO/Rh     Status: None   Collection Time: 12/22/18  9:47 PM  Result Value Ref Range   ABO/RH(D)      B NEG Performed at [facility], [address] [place], [place] [address]   Rh IG workup (includes ABO/Rh)     Status: None (Preliminary result)   Collection Time: 12/22/18  9:47 PM  Result Value Ref Range   Gestational Age(Wks) 6    ABO/RH(D) B NEG    Antibody Screen NEG    Unit Number [id number]    Blood Component Type RHIG    Unit division 00    Status of Unit ISSUED    Transfusion Status      OK TO TRANSFUSE Performed at [facility], [address] [place], [place] [address]     Meds ordered this encounter  Medications  . rho (d) immune globulin (RHIG/RHOPHYLAC) injection 300 mcg   US Ob Transvaginal  Result Date: 12/22/2018 CLINICAL DATA:  Bleeding EXAM: TRANSVAGINAL OB ULTRASOUND TECHNIQUE: Transvaginal ultrasound was performed for complete evaluation of the gestation as well as the maternal uterus, adnexal regions, and pelvic cul-de-sac. COMPARISON:  12/13/2018 FINDINGS: Intrauterine gestational sac: None Yolk sac:  Not visualized Embryo:  Not visualized Cardiac Activity: Heart Rate:  bpm MSD:   mm    w     d CRL:     mm    w  d                  [place] EDC: Subchorionic hemorrhage:  None visualized. Maternal uterus/adnexae: No adnexal mass or free fluid. IMPRESSION: No intrauterine pregnancy.  No change since prior study. Electronically Signed   By: Charlett Nose M.D.   On: 12/22/2018 22:09     Assessment and Plan  --23 y.o. G1P0 complete miscarriage, no evidence of retained POC --Quant 513 (9 days ago)-->79 --B NEGATIVE: Given Rhogam --Discharge home in stable condition with precautions for signs of worsening acuity  F/U: Patient to schedule repeat Quant hCG within one week. Discussed clinic appt, walk-in clinic or MAU. Patient plans to return to MAU for Rozelle Logan, CNM 12/22/18  11:54 PM

## 2018-12-22 NOTE — Discharge Instructions (Signed)
Miscarriage  A miscarriage is the loss of an unborn baby (fetus) before the 20th week of pregnancy.  Follow these instructions at home:  Medicines    · Take over-the-counter and prescription medicines only as told by your doctor.  · If you were prescribed antibiotic medicine, take it as told by your doctor. Do not stop taking the antibiotic even if you start to feel better.  · Do not take NSAIDs unless your doctor says that this is safe for you. NSAIDs include aspirin and ibuprofen. These medicines can cause bleeding.  Activity  · Rest as directed. Ask your doctor what activities are safe for you.  · Have someone help you at home during this time.  General instructions  · Write down how many pads you use each day and how soaked they are.  · Watch the amount of tissue or clumps of blood (blood clots) that you pass from your vagina. Save any large amounts of tissue for your doctor.  · Do not use tampons, douche, or have sex until your doctor approves.  · To help you and your partner with the process of grieving, talk with your doctor or seek counseling.  · When you are ready, meet with your doctor to talk about steps you should take for your health. Also, talk with your doctor about steps to take to have a healthy pregnancy in the future.  · Keep all follow-up visits as told by your doctor. This is important.  Contact a doctor if:  · You have a fever or chills.  · You have vaginal discharge that smells bad.  · You have more bleeding.  Get help right away if:  · You have very bad cramps or pain in your back or belly.  · You pass clumps of blood that are walnut-sized or larger from your vagina.  · You pass tissue that is walnut-sized or larger from your vagina.  · You soak more than 1 regular pad in an hour.  · You get light-headed or weak.  · You faint (pass out).  · You have feelings of sadness that do not go away, or you have thoughts of hurting yourself.  Summary  · A miscarriage is the loss of an unborn baby before  the 20th week of pregnancy.  · Follow your doctor's instructions for home care. Keep all follow-up appointments.  · To help you and your partner with the process of grieving, talk with your doctor or seek counseling.  This information is not intended to replace advice given to you by your health care provider. Make sure you discuss any questions you have with your health care provider.  Document Released: 01/05/2012 Document Revised: 11/18/2016 Document Reviewed: 11/18/2016  Elsevier Interactive Patient Education © 2019 [employer]

## 2018-12-23 LAB — RH IG WORKUP (INCLUDES ABO/RH)
ABO/RH(D): B NEG
Antibody Screen: NEGATIVE
Gestational Age(Wks): 6
Unit division: 0

## 2018-12-23 LAB — GC/CHLAMYDIA PROBE AMP (~~LOC~~) NOT AT ARMC
Chlamydia: NEGATIVE
Neisseria Gonorrhea: NEGATIVE

## 2018-12-24 DIAGNOSIS — Z6791 Unspecified blood type, Rh negative: Secondary | ICD-10-CM | POA: Diagnosis present

## 2019-02-26 ENCOUNTER — Encounter (HOSPITAL_COMMUNITY): Payer: Self-pay | Admitting: Advanced Practice Midwife

## 2019-02-26 ENCOUNTER — Other Ambulatory Visit: Payer: Self-pay

## 2019-02-26 ENCOUNTER — Inpatient Hospital Stay (HOSPITAL_COMMUNITY): Payer: 59

## 2019-02-26 ENCOUNTER — Inpatient Hospital Stay (HOSPITAL_COMMUNITY)
Admission: AD | Admit: 2019-02-26 | Discharge: 2019-02-26 | Disposition: A | Discharge status: Home / Self Care | Payer: 59 | Attending: Obstetrics and Gynecology | Admitting: Obstetrics and Gynecology

## 2019-02-26 DIAGNOSIS — R109 Unspecified abdominal pain: Secondary | ICD-10-CM

## 2019-02-26 DIAGNOSIS — O3481 Maternal care for other abnormalities of pelvic organs, first trimester: Secondary | ICD-10-CM | POA: Insufficient documentation

## 2019-02-26 DIAGNOSIS — Z3A01 Less than 8 weeks gestation of pregnancy: Secondary | ICD-10-CM | POA: Diagnosis not present

## 2019-02-26 DIAGNOSIS — N83202 Unspecified ovarian cyst, left side: Secondary | ICD-10-CM | POA: Insufficient documentation

## 2019-02-26 DIAGNOSIS — Z3491 Encounter for supervision of normal pregnancy, unspecified, first trimester: Secondary | ICD-10-CM

## 2019-02-26 DIAGNOSIS — Z87891 Personal history of nicotine dependence: Secondary | ICD-10-CM | POA: Insufficient documentation

## 2019-02-26 DIAGNOSIS — R1030 Lower abdominal pain, unspecified: Secondary | ICD-10-CM | POA: Diagnosis not present

## 2019-02-26 DIAGNOSIS — O23591 Infection of other part of genital tract in pregnancy, first trimester: Secondary | ICD-10-CM | POA: Diagnosis not present

## 2019-02-26 DIAGNOSIS — N760 Acute vaginitis: Secondary | ICD-10-CM | POA: Diagnosis not present

## 2019-02-26 DIAGNOSIS — B9689 Other specified bacterial agents as the cause of diseases classified elsewhere: Secondary | ICD-10-CM | POA: Insufficient documentation

## 2019-02-26 DIAGNOSIS — O9989 Other specified diseases and conditions complicating pregnancy, childbirth and the puerperium: Secondary | ICD-10-CM | POA: Insufficient documentation

## 2019-02-26 DIAGNOSIS — Z792 Long term (current) use of antibiotics: Secondary | ICD-10-CM | POA: Diagnosis not present

## 2019-02-26 DIAGNOSIS — O26891 Other specified pregnancy related conditions, first trimester: Secondary | ICD-10-CM | POA: Diagnosis not present

## 2019-02-26 HISTORY — DX: Anxiety disorder, unspecified: F41.9

## 2019-02-26 LAB — CBC WITH DIFFERENTIAL/PLATELET
Abs Immature Granulocytes: 0.02 10*3/uL (ref 0.00–0.07)
Basophils Absolute: 0.1 10*3/uL (ref 0.0–0.1)
Basophils Relative: 1 %
Eosinophils Absolute: 0.1 10*3/uL (ref 0.0–0.5)
Eosinophils Relative: 1 %
HCT: 37.2 % (ref 36.0–46.0)
Hemoglobin: 12.2 g/dL (ref 12.0–15.0)
Immature Granulocytes: 0 %
Lymphocytes Relative: 40 %
Lymphs Abs: 2.5 10*3/uL (ref 0.7–4.0)
MCH: 28.1 pg (ref 26.0–34.0)
MCHC: 32.8 g/dL (ref 30.0–36.0)
MCV: 85.7 fL (ref 80.0–100.0)
Monocytes Absolute: 0.5 10*3/uL (ref 0.1–1.0)
Monocytes Relative: 8 %
Neutro Abs: 3.2 10*3/uL (ref 1.7–7.7)
Neutrophils Relative %: 50 %
Platelets: 229 10*3/uL (ref 150–400)
RBC: 4.34 MIL/uL (ref 3.87–5.11)
RDW: 13.6 % (ref 11.5–15.5)
WBC: 6.3 10*3/uL (ref 4.0–10.5)
nRBC: 0 % (ref 0.0–0.2)

## 2019-02-26 LAB — OB RESULTS CONSOLE HIV ANTIBODY (ROUTINE TESTING): HIV: NONREACTIVE

## 2019-02-26 LAB — URINALYSIS, ROUTINE W REFLEX MICROSCOPIC
Bilirubin Urine: NEGATIVE
Glucose, UA: NEGATIVE mg/dL
Hgb urine dipstick: NEGATIVE
Ketones, ur: NEGATIVE mg/dL
Leukocytes,Ua: NEGATIVE
Nitrite: NEGATIVE
Protein, ur: NEGATIVE mg/dL
Specific Gravity, Urine: 1.005 (ref 1.005–1.030)
pH: 6 (ref 5.0–8.0)

## 2019-02-26 LAB — WET PREP, GENITAL
Sperm: NONE SEEN
Trich, Wet Prep: NONE SEEN
Yeast Wet Prep HPF POC: NONE SEEN

## 2019-02-26 LAB — OB RESULTS CONSOLE RPR: RPR: NONREACTIVE

## 2019-02-26 LAB — OB RESULTS CONSOLE RUBELLA ANTIBODY, IGM: Rubella: IMMUNE

## 2019-02-26 LAB — OB RESULTS CONSOLE HEPATITIS B SURFACE ANTIGEN: Hepatitis B Surface Ag: NEGATIVE

## 2019-02-26 LAB — HCG, QUANTITATIVE, PREGNANCY: hCG, Beta Chain, Quant, S: 25109 m[IU]/mL — ABNORMAL HIGH (ref <5)

## 2019-02-26 MED ORDER — METRONIDAZOLE 500 MG PO TABS: 500.0000 mg | ORAL_TABLET | Freq: Two times a day (BID) | ORAL | 0 refills | Status: DC

## 2019-02-26 NOTE — MAU Provider Note (Signed)
Chief Complaint: Abdominal Pain   First Provider Initiated Contact with Patient 02/26/19 1606     SUBJECTIVE HPI: Melanie Valencia is a 23 y.o. G2P0010 at 8395w2d by certain LMP who presents to Maternity Admissions reporting: bilat low abd cramping x 1 month. Had SAB late Feb 2020. Quants were followed to <1. Patient's last menstrual period was 01/20/2019. Pos home UPT. States she has [facility] appointment scheduled at [facility] in 1 month called them about this pain, but they were not able to see her earlier.   States was recently Dx'd w/ BV and yeast. Discharge improved. Was not able to complete course of Monistat 7 due to discomfort w/ applicator.   Conflict (See Lab Report): B NEG/B NEG Performed at [facility], [address] [place], [place] [address]   Pain Location: Bilateral low abdomen/groin Quality: Cramping Severity: Mild Duration: 1 month Course: Unchanged Context: Positive UPT Timing: Intermittent Modifying factors: Positional. Associated Sx: Neg for fever, chills, vaginal bleeding, vaginal discharge, dizziness, urinary complaints, GI complaints.  Past Medical History:  Diagnosis Date  . Anxiety    OB History  Gravida Para Term Preterm AB Living  2       1    SAB TAB Ectopic Multiple Live Births  1            # Outcome Date GA Lbr Len/2nd Weight Sex Delivery Anes PTL Lv  2 Current           1 SAB 12/22/18 3080w4d          Past Surgical History:  Procedure Laterality Date  . NO PAST SURGERIES     Social History   Socioeconomic History  . Marital status: Single    Spouse name: Not on file  . Number of children: Not on file  . Years of education: Not on file  . Highest education level: Not on file  Occupational History  . Not on file  Social Needs  . Financial resource strain: Not on file  . Food insecurity:    Worry: Not on file    Inability: Not on file  . Transportation needs:    Medical: Not on file    Non-medical: Not on file  Tobacco Use  . Smoking  status: Former Games developermoker  . Smokeless tobacco: Never Used  Substance and Sexual Activity  . Alcohol use: Yes  . Drug use: No  . Sexual activity: Yes    Birth control/protection: None  Lifestyle  . Physical activity:    Days per week: Not on file    Minutes per session: Not on file  . Stress: Not on file  Relationships  . Social connections:    Talks on phone: Not on file    Gets together: Not on file    Attends religious service: Not on file    Active member of club or organization: Not on file    Attends meetings of clubs or organizations: Not on file    Relationship status: Not on file  . Intimate partner violence:    Fear of current or ex partner: Not on file    Emotionally abused: Not on file    Physically abused: Not on file    Forced sexual activity: Not on file  Other Topics Concern  . Not on file  Social History Narrative  . Not on file   No current facility-administered medications on file prior to encounter.    No current outpatient medications on file prior to encounter.  No Known Allergies  I have reviewed the past Medical Hx, Surgical Hx, Social Hx, Allergies and Medications.   Review of Systems  Constitutional: Negative for chills and fever.  Gastrointestinal: Positive for abdominal pain. Negative for abdominal distention, constipation, diarrhea, nausea and vomiting.  Genitourinary: Negative for difficulty urinating, dysuria, frequency, pelvic pain, urgency, vaginal bleeding and vaginal discharge.  Neurological: Negative for dizziness.    OBJECTIVE Patient Vitals for the past 24 hrs:  BP Temp Temp src Pulse Resp SpO2 Weight  02/26/19 1912 126/70 - - 81 18 100 % -  02/26/19 1549 137/75 98.2 F (36.8 C) Oral (!) 113 19 100 % -  02/26/19 1536 - - - - - - 82.9 kg   Constitutional: Well-developed, well-nourished female in no acute distress.  Cardiovascular: normal rate Respiratory: normal rate and effort.  GI: Abd soft, non-tender. Pos BS x 4 MS:  Extremities nontender, no edema, normal ROM Neurologic: Alert and oriented x 4.  GU: Uterus slightly enlarged.  No vaginal bleeding.  Normal, physiologic discharge.  No adnexal masses or tenderness.  No CVA tenderness.  LAB RESULTS Results for orders placed or performed during the hospital encounter of 02/26/19 (from the past 24 hour(s))  Urinalysis, Routine w reflex microscopic     Status: Abnormal   Collection Time: 02/26/19  3:38 PM  Result Value Ref Range   Color, Urine STRAW (A) YELLOW   APPearance CLEAR CLEAR   Specific Gravity, Urine 1.005 1.005 - 1.030   pH 6.0 5.0 - 8.0   Glucose, UA NEGATIVE NEGATIVE mg/dL   Hgb urine dipstick NEGATIVE NEGATIVE   Bilirubin Urine NEGATIVE NEGATIVE   Ketones, ur NEGATIVE NEGATIVE mg/dL   Protein, ur NEGATIVE NEGATIVE mg/dL   Nitrite NEGATIVE NEGATIVE   Leukocytes,Ua NEGATIVE NEGATIVE  hCG, quantitative, pregnancy     Status: Abnormal   Collection Time: 02/26/19  4:15 PM  Result Value Ref Range   hCG, Beta Chain, Quant, S 25,109 (H) <5 mIU/mL  CBC with Differential/Platelet     Status: None   Collection Time: 02/26/19  4:15 PM  Result Value Ref Range   WBC 6.3 4.0 - 10.5 K/uL   RBC 4.34 3.87 - 5.11 MIL/uL   Hemoglobin 12.2 12.0 - 15.0 g/dL   HCT 78.4 69.6 - 29.5 %   MCV 85.7 80.0 - 100.0 fL   MCH 28.1 26.0 - 34.0 pg   MCHC 32.8 30.0 - 36.0 g/dL   RDW 28.4 13.2 - 44.0 %   Platelets 229 150 - 400 K/uL   nRBC 0.0 0.0 - 0.2 %   Neutrophils Relative % 50 %   Neutro Abs 3.2 1.7 - 7.7 K/uL   Lymphocytes Relative 40 %   Lymphs Abs 2.5 0.7 - 4.0 K/uL   Monocytes Relative 8 %   Monocytes Absolute 0.5 0.1 - 1.0 K/uL   Eosinophils Relative 1 %   Eosinophils Absolute 0.1 0.0 - 0.5 K/uL   Basophils Relative 1 %   Basophils Absolute 0.1 0.0 - 0.1 K/uL   Immature Granulocytes 0 %   Abs Immature Granulocytes 0.02 0.00 - 0.07 K/uL  Wet prep, genital     Status: Abnormal   Collection Time: 02/26/19  5:58 PM  Result Value Ref Range   Yeast Wet  Prep HPF POC NONE SEEN NONE SEEN   Trich, Wet Prep NONE SEEN NONE SEEN   Clue Cells Wet Prep HPF POC PRESENT (A) NONE SEEN   WBC, Wet Prep HPF POC FEW (A) NONE  SEEN   Sperm NONE SEEN     IMAGING US Ob Comp Less 14 Wks  Result Date: 02/26/2019 CLINICAL DATA:  Pregnant patient with abdominal pain. EXAM: OBSTETRIC <14 WK [place] AND TRANSVAGINAL OB US TECHNIQUE: Both transabdominal and transvaginal ultrasound examinations were performed for complete evaluation of the gestation as well as the maternal uterus, adnexal regions, and pelvic cul-de-sac. Transvaginal technique was performed to assess early pregnancy. COMPARISON:  None. FINDINGS: Intrauterine gestational sac: Single Yolk sac:  Visualized. Embryo:  Visualized. Cardiac Activity: Visualized. Heart Rate: 95 bpm MSD: 12 mm   6 w   0 d CRL:  1.48 mm    w    d                  [place] EDC: Subchorionic hemorrhage:  There is a small subchorionic hemorrhage. Maternal uterus/adnexae: The right ovary is normal. A 2.9 cm corpus luteum cyst is seen in the left ovary. A small subserosal fundal fibroid is seen in the uterus measuring 13 mm. A moderate to large amount of fluid in the pelvis. IMPRESSION: 1. Single live IUP. 2. There is a small subchorionic hemorrhage. 3. There is a moderate to large amount of free fluid in the pelvis which is a nonspecific finding. This could be due to a ruptured follicle or cyst. 4. Corpus luteum cyst in the left ovary. 5. Small fibroid in the uterus. Electronically Signed   By: Gerome Sam III M.D   On: 02/26/2019 18:46   US Ob Transvaginal  Result Date: 02/26/2019 CLINICAL DATA:  Pregnant patient with abdominal pain. EXAM: OBSTETRIC <14 WK [place] AND TRANSVAGINAL OB US TECHNIQUE: Both transabdominal and transvaginal ultrasound examinations were performed for complete evaluation of the gestation as well as the maternal uterus, adnexal regions, and pelvic cul-de-sac. Transvaginal technique was performed to assess early pregnancy.  COMPARISON:  None. FINDINGS: Intrauterine gestational sac: Single Yolk sac:  Visualized. Embryo:  Visualized. Cardiac Activity: Visualized. Heart Rate: 95 bpm MSD: 12 mm   6 w   0 d CRL:  1.48 mm    w    d                  [place] EDC: Subchorionic hemorrhage:  There is a small subchorionic hemorrhage. Maternal uterus/adnexae: The right ovary is normal. A 2.9 cm corpus luteum cyst is seen in the left ovary. A small subserosal fundal fibroid is seen in the uterus measuring 13 mm. A moderate to large amount of fluid in the pelvis. IMPRESSION: 1. Single live IUP. 2. There is a small subchorionic hemorrhage. 3. There is a moderate to large amount of free fluid in the pelvis which is a nonspecific finding. This could be due to a ruptured follicle or cyst. 4. Corpus luteum cyst in the left ovary. 5. Small fibroid in the uterus. Electronically Signed   By: Gerome Sam III M.D   On: 02/26/2019 18:46    MAU COURSE CBC, Quant, ultrasound, wet prep and GC/chlamydia culture, UA  MDM - Pain and bleeding in early pregnancy with normal intrauterine pregnancy and hemodynamically stable.  - Explained corpus luteum cyst and small subchorionic hemorrhage to patient.  -BV.  Rx Flagyl.  ASSESSMENT 1. Normal IUP (intrauterine pregnancy) on prenatal ultrasound, first trimester   2. Abdominal pain during pregnancy in first trimester   3. BV (bacterial vaginosis)     PLAN Discharge home in stable condition. First trimester precautions Follow-up Information    [facility] Follow up in 1 month(s).   Specialty:  Obstetrics and Gynecology Why:  As scheduled or sooner as needed if symptoms worsen Contact information: [address] [place] [address] [phone]       [facility] Follow up.   Specialty:  Obstetrics and Gynecology Why:  As needed in pregnancy emergencies Contact information: [address] [id number] [address] [place] [address] [phone]         Allergies as of 02/26/2019   No Known Allergies     Medication List    TAKE these medications   metroNIDAZOLE 500 MG tablet Commonly known as:  Flagyl Take 1 tablet (500 mg total) by mouth 2 (two) times daily.        Katrinka Blazing, [place] 02/26/2019  7:14 PM  4

## 2019-02-26 NOTE — MAU Note (Signed)
Melanie Valencia is a 23 y.o. here in MAU reporting:  +abdominal cramping. More upon awakening. Intermittent and last 10-15 minutes at a time.  LMP: March 26. States had a miscarriage a couple months ago. Onset of complaint: ongoing since she was dx and began treatment for BV recently. Pain score: 6-7/10 when it happens Denies vaginal bleeding or discharge at this time. Reports that she got a yeast infection after being treated for BV. Has 3 days left of monistat but stopped the treatment because she reports that she was having discomfort with the administration process as well as vaginal pressure. Has an appointment with [facility] at the end of the month. States has tried to call them to get in sooner for this but got no response. +difficulty breathing. Patient feels like this is associated with her anxiety. States the breathing gets worse when she thinks about how she feels now and how she had the miscarriage 2 months ago. Vitals:   02/26/19 1549  BP: 137/75  Pulse: (!) 113  Resp: 19  Temp: 98.2 F (36.8 C)  SpO2: 100%      Lab orders placed from triage: ua

## 2019-02-26 NOTE — Discharge Instructions (Signed)
Your estimated due date is 10/22/2019.  You have been diagnosed with a corpus luteum cyst which is a normal cyst of pregnancy.  It is not dangerous to you or your baby.  Abdominal Pain During Pregnancy  Abdominal pain is common during pregnancy, and has many possible causes. Some causes are more serious than others, and sometimes the cause is not known. Abdominal pain can be a sign that labor is starting. It can also be caused by normal growth and stretching of muscles and ligaments during pregnancy. Always tell your health care provider if you have any abdominal pain. Follow these instructions at home:  Do not have sex or put anything in your vagina until your pain goes away completely.  Get plenty of rest until your pain improves.  Drink enough fluid to keep your urine pale yellow.  Take over-the-counter and prescription medicines only as told by your health care provider.  Keep all follow-up visits as told by your health care provider. This is important. Contact a health care provider if:  Your pain continues or gets worse after resting.  You have lower abdominal pain that: ? Comes and goes at regular intervals. ? Spreads to your back. ? Is similar to menstrual cramps.  You have pain or burning when you urinate. Get help right away if:  You have a fever or chills.  You have vaginal bleeding.  You are leaking fluid from your vagina.  You are passing tissue from your vagina.  You have vomiting or diarrhea that lasts for more than 24 hours.  Your baby is moving less than usual.  You feel very weak or faint.  You have shortness of breath.  You develop severe pain in your upper abdomen. Summary  Abdominal pain is common during pregnancy, and has many possible causes.  If you experience abdominal pain during pregnancy, tell your health care provider right away.  Follow your health care provider's home care instructions and keep all follow-up visits as directed. This  information is not intended to replace advice given to you by your health care provider. Make sure you discuss any questions you have with your health care provider. Document Released: 10/13/2005 Document Revised: 01/15/2017 Document Reviewed: 01/15/2017 Elsevier Interactive Patient Education  2019 [employer].

## 2019-02-28 LAB — GC/CHLAMYDIA PROBE AMP (~~LOC~~) NOT AT ARMC
Chlamydia: NEGATIVE
Neisseria Gonorrhea: NEGATIVE

## 2019-03-24 ENCOUNTER — Encounter (HOSPITAL_COMMUNITY): Payer: Self-pay | Admitting: Obstetrics & Gynecology

## 2019-03-24 ENCOUNTER — Other Ambulatory Visit (HOSPITAL_COMMUNITY): Payer: Self-pay | Admitting: Obstetrics and Gynecology

## 2019-04-19 ENCOUNTER — Other Ambulatory Visit (HOSPITAL_COMMUNITY): Payer: Self-pay | Admitting: Obstetrics and Gynecology

## 2019-05-18 ENCOUNTER — Emergency Department (HOSPITAL_COMMUNITY)
Admission: EM | Admit: 2019-05-18 | Discharge: 2019-05-18 | Disposition: A | Discharge status: Home / Self Care | Payer: 59 | Attending: Emergency Medicine | Admitting: Emergency Medicine

## 2019-05-18 ENCOUNTER — Other Ambulatory Visit: Payer: Self-pay

## 2019-05-18 DIAGNOSIS — Z3A17 17 weeks gestation of pregnancy: Secondary | ICD-10-CM | POA: Diagnosis not present

## 2019-05-18 DIAGNOSIS — Y999 Unspecified external cause status: Secondary | ICD-10-CM | POA: Diagnosis not present

## 2019-05-18 DIAGNOSIS — Y93I9 Activity, other involving external motion: Secondary | ICD-10-CM | POA: Diagnosis not present

## 2019-05-18 DIAGNOSIS — Y92410 Unspecified street and highway as the place of occurrence of the external cause: Secondary | ICD-10-CM | POA: Diagnosis not present

## 2019-05-18 DIAGNOSIS — N3001 Acute cystitis with hematuria: Secondary | ICD-10-CM

## 2019-05-18 DIAGNOSIS — Z87891 Personal history of nicotine dependence: Secondary | ICD-10-CM | POA: Diagnosis not present

## 2019-05-18 DIAGNOSIS — M7918 Myalgia, other site: Secondary | ICD-10-CM

## 2019-05-18 LAB — CBC WITH DIFFERENTIAL/PLATELET
Abs Immature Granulocytes: 0.02 10*3/uL (ref 0.00–0.07)
Basophils Absolute: 0 10*3/uL (ref 0.0–0.1)
Basophils Relative: 1 %
Eosinophils Absolute: 0 10*3/uL (ref 0.0–0.5)
Eosinophils Relative: 1 %
HCT: 34 % — ABNORMAL LOW (ref 36.0–46.0)
Hemoglobin: 11.3 g/dL — ABNORMAL LOW (ref 12.0–15.0)
Immature Granulocytes: 0 %
Lymphocytes Relative: 24 %
Lymphs Abs: 1.5 10*3/uL (ref 0.7–4.0)
MCH: 29 pg (ref 26.0–34.0)
MCHC: 33.2 g/dL (ref 30.0–36.0)
MCV: 87.2 fL (ref 80.0–100.0)
Monocytes Absolute: 0.6 10*3/uL (ref 0.1–1.0)
Monocytes Relative: 9 %
Neutro Abs: 4.2 10*3/uL (ref 1.7–7.7)
Neutrophils Relative %: 65 %
Platelets: 184 10*3/uL (ref 150–400)
RBC: 3.9 MIL/uL (ref 3.87–5.11)
RDW: 13.9 % (ref 11.5–15.5)
WBC: 6.4 10*3/uL (ref 4.0–10.5)
nRBC: 0 % (ref 0.0–0.2)

## 2019-05-18 LAB — URINALYSIS, ROUTINE W REFLEX MICROSCOPIC
Bilirubin Urine: NEGATIVE
Glucose, UA: NEGATIVE mg/dL
Ketones, ur: 5 mg/dL — AB
Nitrite: NEGATIVE
Protein, ur: NEGATIVE mg/dL
Specific Gravity, Urine: 1.014 (ref 1.005–1.030)
pH: 7 (ref 5.0–8.0)

## 2019-05-18 LAB — COMPREHENSIVE METABOLIC PANEL
ALT: 23 U/L (ref 0–44)
AST: 19 U/L (ref 15–41)
Albumin: 3.3 g/dL — ABNORMAL LOW (ref 3.5–5.0)
Alkaline Phosphatase: 53 U/L (ref 38–126)
Anion gap: 7 (ref 5–15)
BUN: 5 mg/dL — ABNORMAL LOW (ref 6–20)
CO2: 21 mmol/L — ABNORMAL LOW (ref 22–32)
Calcium: 9.5 mg/dL (ref 8.9–10.3)
Chloride: 109 mmol/L (ref 98–111)
Creatinine, Ser: 0.56 mg/dL (ref 0.44–1.00)
GFR calc Af Amer: >60 mL/min (ref >60)
GFR calc non Af Amer: >60 mL/min (ref >60)
Glucose, Bld: 101 mg/dL — ABNORMAL HIGH (ref 70–99)
Potassium: 3.6 mmol/L (ref 3.5–5.1)
Sodium: 137 mmol/L (ref 135–145)
Total Bilirubin: 0.2 mg/dL — ABNORMAL LOW (ref 0.3–1.2)
Total Protein: 6.4 g/dL — ABNORMAL LOW (ref 6.5–8.1)

## 2019-05-18 LAB — CBG MONITORING, ED: Glucose-Capillary: 88 mg/dL (ref 70–99)

## 2019-05-18 LAB — ABO/RH: ABO/RH(D): B NEG

## 2019-05-18 LAB — LIPASE, BLOOD: Lipase: 29 U/L (ref 11–51)

## 2019-05-18 MED ORDER — RHO D IMMUNE GLOBULIN 1500 UNIT/2ML IJ SOSY
300.0000 ug | PREFILLED_SYRINGE | Freq: Once | INTRAMUSCULAR | Status: AC
  Administered 2019-05-18: 300 ug via INTRAMUSCULAR

## 2019-05-18 MED ORDER — RHO D IMMUNE GLOBULIN 1500 UNIT/2ML IJ SOSY: 300.0000 ug | PREFILLED_SYRINGE | Freq: Once | INTRAMUSCULAR | Status: DC

## 2019-05-18 MED ORDER — CEPHALEXIN 500 MG PO CAPS: 500.0000 mg | ORAL_CAPSULE | Freq: Two times a day (BID) | ORAL | 0 refills | Status: AC

## 2019-05-18 MED ORDER — ACETAMINOPHEN 325 MG PO TABS
650.0000 mg | ORAL_TABLET | Freq: Once | ORAL | Status: AC
  Administered 2019-05-18: 650 mg via ORAL
  Filled 2019-05-18: qty 2
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
## 2019-05-18 NOTE — Discharge Instructions (Addendum)
You may take 650 mg of Tylenol every 6 hours for pain.  You may also use cold compresses to help with your back pain.  Your urinalysis today show that you might have a urinary tract infection.  You will be given antibiotics to cover this.  A culture was sent of your urine today to determine if there is any bacterial growth. If the results of the culture are positive and you require an antibiotic or a change of your prescribed antibiotic you will be contacted by the hospital. If the results are negative you will not be contacted.  Please make an appointment with your OB/GYN and/or your primary care doctor in the next 3 to 5 days for reevaluation.  Return to the emergency department for any chest pain, abdominal pain, vaginal bleeding or loss of fluids, numbness/weakness, or any new or worsening symptoms.
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
## 2019-05-18 NOTE — ED Notes (Signed)
Pt made aware that we need another urine sample

## 2019-05-19 LAB — RH IG WORKUP (INCLUDES ABO/RH)
ABO/RH(D): B NEG
Antibody Screen: NEGATIVE
Gestational Age(Wks): 16
Unit division: 0

## 2019-05-20 ENCOUNTER — Ambulatory Visit (HOSPITAL_COMMUNITY): Payer: Self-pay | Admitting: Obstetrics and Gynecology

## 2019-05-20 ENCOUNTER — Ambulatory Visit (HOSPITAL_COMMUNITY): Payer: 59 | Attending: Obstetrics and Gynecology

## 2019-05-20 ENCOUNTER — Other Ambulatory Visit: Payer: Self-pay

## 2019-05-20 NOTE — Progress Notes (Signed)
Pt in telegenetics with Karyn ([facility]). 

## 2019-05-21 LAB — URINE CULTURE: Culture: >=100000 — AB

## 2019-05-22 ENCOUNTER — Telehealth: Payer: Self-pay | Admitting: *Deleted

## 2019-05-22 NOTE — Telephone Encounter (Signed)
Post ED Visit - Positive Culture Follow-up  Culture report reviewed by antimicrobial stewardship pharmacist: [place] Team []  Elenor Quinones, Pharm.D. []  Heide Guile, Pharm.D., BCPS AQ-ID []  Parks Neptune, Pharm.D., BCPS []  Alycia Rossetti, Pharm.D., BCPS []  [place], Pharm.D., BCPS, AAHIVP []  Legrand Como, Pharm.D., BCPS, AAHIVP [x]  Salome Arnt, PharmD, BCPS []  Johnnette Gourd, PharmD, BCPS []  Hughes Better, PharmD, BCPS []  Leeroy Cha, PharmD []  Laqueta Linden, PharmD, BCPS []  Albertina Parr, PharmD  [place] Team []  Leodis Sias, PharmD []  Lindell Spar, PharmD []  Royetta Asal, PharmD []  Graylin Shiver, Rph []  Rema Fendt) Glennon Mac, PharmD []  Arlyn Dunning, PharmD []  Netta Cedars, PharmD []  Dia Sitter, PharmD []  Leone Haven, PharmD []  Gretta Arab, PharmD []  Theodis Shove, PharmD []  Peggyann Juba, PharmD []  Reuel Boom, PharmD   Positive urine culture Treated with Cephalexin, organism sensitive to the same and no further patient follow-up is required at this time.  Harlon Flor [facility] 05/22/2019, 2:52 PM

## 2019-05-24 ENCOUNTER — Inpatient Hospital Stay (HOSPITAL_COMMUNITY)
Admission: AD | Admit: 2019-05-24 | Discharge: 2019-05-24 | Disposition: A | Discharge status: Home / Self Care | Payer: 59 | Attending: Obstetrics and Gynecology | Admitting: Obstetrics and Gynecology

## 2019-05-24 ENCOUNTER — Other Ambulatory Visit: Payer: Self-pay

## 2019-05-24 DIAGNOSIS — M549 Dorsalgia, unspecified: Secondary | ICD-10-CM | POA: Insufficient documentation

## 2019-05-24 DIAGNOSIS — Z3A18 18 weeks gestation of pregnancy: Secondary | ICD-10-CM | POA: Insufficient documentation

## 2019-05-24 DIAGNOSIS — X58XXXA Exposure to other specified factors, initial encounter: Secondary | ICD-10-CM | POA: Diagnosis not present

## 2019-05-24 DIAGNOSIS — M542 Cervicalgia: Secondary | ICD-10-CM | POA: Insufficient documentation

## 2019-05-24 DIAGNOSIS — O26893 Other specified pregnancy related conditions, third trimester: Secondary | ICD-10-CM | POA: Diagnosis not present

## 2019-05-24 DIAGNOSIS — O9A212 Injury, poisoning and certain other consequences of external causes complicating pregnancy, second trimester: Secondary | ICD-10-CM | POA: Diagnosis not present
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
## 2019-07-19 ENCOUNTER — Inpatient Hospital Stay (HOSPITAL_COMMUNITY)
Admission: AD | Admit: 2019-07-19 | Discharge: 2019-07-19 | Disposition: A | Discharge status: Home / Self Care | Payer: 59 | Attending: Obstetrics and Gynecology | Admitting: Obstetrics and Gynecology

## 2019-07-19 ENCOUNTER — Other Ambulatory Visit: Payer: Self-pay

## 2019-07-19 ENCOUNTER — Other Ambulatory Visit: Payer: Self-pay | Admitting: Certified Nurse Midwife

## 2019-07-19 ENCOUNTER — Encounter (HOSPITAL_COMMUNITY): Payer: Self-pay | Admitting: *Deleted

## 2019-07-19 DIAGNOSIS — E860 Dehydration: Secondary | ICD-10-CM | POA: Insufficient documentation

## 2019-07-19 DIAGNOSIS — Z3A25 25 weeks gestation of pregnancy: Secondary | ICD-10-CM | POA: Diagnosis not present

## 2019-07-19 DIAGNOSIS — O26892 Other specified pregnancy related conditions, second trimester: Secondary | ICD-10-CM | POA: Insufficient documentation

## 2019-07-19 DIAGNOSIS — R109 Unspecified abdominal pain: Secondary | ICD-10-CM

## 2019-07-19 DIAGNOSIS — O26899 Other specified pregnancy related conditions, unspecified trimester: Secondary | ICD-10-CM

## 2019-07-19 DIAGNOSIS — O99282 Endocrine, nutritional and metabolic diseases complicating pregnancy, second trimester: Secondary | ICD-10-CM | POA: Diagnosis not present

## 2019-07-19 HISTORY — DX: Unspecified infectious disease: B99.9

## 2019-07-19 LAB — URINALYSIS, ROUTINE W REFLEX MICROSCOPIC
Bilirubin Urine: NEGATIVE
Glucose, UA: NEGATIVE mg/dL
Hgb urine dipstick: NEGATIVE
Ketones, ur: NEGATIVE mg/dL
Nitrite: NEGATIVE
Protein, ur: NEGATIVE mg/dL
Specific Gravity, Urine: 1.019 (ref 1.005–1.030)
pH: 6 (ref 5.0–8.0)

## 2019-07-19 LAB — BASIC METABOLIC PANEL
Anion gap: 9 (ref 5–15)
BUN: 5 mg/dL — ABNORMAL LOW (ref 6–20)
CO2: 22 mmol/L (ref 22–32)
Calcium: 9 mg/dL (ref 8.9–10.3)
Chloride: 105 mmol/L (ref 98–111)
Creatinine, Ser: 0.45 mg/dL (ref 0.44–1.00)
GFR calc Af Amer: >60 mL/min (ref >60)
GFR calc non Af Amer: >60 mL/min (ref >60)
Glucose, Bld: 78 mg/dL (ref 70–99)
Potassium: 3.6 mmol/L (ref 3.5–5.1)
Sodium: 136 mmol/L (ref 135–145)

## 2019-07-19 LAB — CBC
HCT: 33.4 % — ABNORMAL LOW (ref 36.0–46.0)
Hemoglobin: 11.3 g/dL — ABNORMAL LOW (ref 12.0–15.0)
MCH: 30.1 pg (ref 26.0–34.0)
MCHC: 33.8 g/dL (ref 30.0–36.0)
MCV: 89.1 fL (ref 80.0–100.0)
Platelets: 154 10*3/uL (ref 150–400)
RBC: 3.75 MIL/uL — ABNORMAL LOW (ref 3.87–5.11)
RDW: 13.4 % (ref 11.5–15.5)
WBC: 7.2 10*3/uL (ref 4.0–10.5)
nRBC: 0 % (ref 0.0–0.2)

## 2019-07-19 LAB — WET PREP, GENITAL
Clue Cells Wet Prep HPF POC: NONE SEEN
Sperm: NONE SEEN
Trich, Wet Prep: NONE SEEN
Yeast Wet Prep HPF POC: NONE SEEN

## 2019-07-19 NOTE — MAU Note (Signed)
Nose started bleeding yesterday,  Not gushing. Noted when she blew her nose.  Off and on, noted again today when blew her nose.  Called her dr, has also been feeling light headed and dizzy at times.  Also has been feeling sick at times, so she doesn't usually eat in the morning.  Has also been having cramping for the last wk or so.

## 2019-07-19 NOTE — MAU Provider Note (Signed)
History     CSN: [id number]  Arrival date and time: 07/19/19 9872   First Provider Initiated Contact with Patient 07/19/19 1038      Chief Complaint  Patient presents with  . Abdominal Pain  . Nausea  . Dizziness  . Epistaxis   23 y.o. G2P0010 @25 .5 wks presenting with lightheadedness and nausea episode today. Reports sx started after arriving at work. No syncope or vomiting. Also reports nose bleed yesterday after she used a tissue to clean out her nose. Denies hx of coagulopathies. Also c/o low abd cramping x1 week. Cramping is intermittent. She tried Tylenol which helped little. No VB or discharge but reports vaginal odor during this pregnancy. No urinary sx. Reports good FM. She did not eat today d/t nausea and poor appetite. Poor appetite and low weight gain have been ongoing during this pregnancy. She admits to poor hydration some days.   OB History    Gravida  2   Para      Term      Preterm      AB  1   Living        SAB  1   TAB      Ectopic      Multiple      Live Births              Past Medical History:  Diagnosis Date  . Anxiety    denies  . Infection    UTI    Past Surgical History:  Procedure Laterality Date  . NO PAST SURGERIES      Family History  Problem Relation Age of Onset  . Cancer Mother        liver  . Asthma Father     Social History   Tobacco Use  . Smoking status: Never Smoker  . Smokeless tobacco: Never Used  Substance Use Topics  . Alcohol use: Not Currently  . Drug use: No    Allergies:  Allergies  Allergen Reactions  . Latex     Itching with condoms    No medications prior to admission.    Review of Systems  Constitutional: Positive for appetite change. Negative for chills and fever.  Gastrointestinal: Positive for abdominal pain and nausea. Negative for constipation, diarrhea and vomiting.  Genitourinary: Negative for dysuria, frequency, vaginal bleeding and vaginal discharge.  Neurological:  Positive for light-headedness. Negative for syncope.   Physical Exam   Blood pressure 122/64, pulse 94, temperature 98.2 F (36.8 C), temperature source Oral, resp. rate 18, height 5\' 3"  (1.6 m), weight 83.7 kg, last menstrual period 01/20/2019, SpO2 100 %, unknown if currently breastfeeding. Orthostatic VS for the past 24 hrs:  BP- Lying Pulse- Lying BP- Sitting Pulse- Sitting BP- Standing at 0 minutes Pulse- Standing at 0 minutes  07/19/19 1056 - - - - 108/62 115  07/19/19 1055 - - 115/66 98 - -  07/19/19 1054 120/64 96 - - - -   Physical Exam  Nursing note and vitals reviewed. Constitutional: She is oriented to person, place, and time. She appears well-developed and well-nourished. No distress.  HENT:  Head: Normocephalic and atraumatic.  Nose: Nose normal. No mucosal edema or nose lacerations. No epistaxis.  No foreign bodies.  Neck: Normal range of motion.  Cardiovascular: Normal rate, regular rhythm and normal heart sounds.  Respiratory: Effort normal and breath sounds normal. No respiratory distress. She has no wheezes. She has no rales.  GI: Soft. She exhibits no distension. There  is no abdominal tenderness.  gravid  Genitourinary:    Genitourinary Comments: External: no lesions or erythema Vagina: rugated, pink, moist, moderate thick creamy discharge Cervix closed/long    Musculoskeletal: Normal range of motion.  Neurological: She is alert and oriented to person, place, and time.  Skin: Skin is warm and dry.  Psychiatric: She has a normal mood and affect.  EFM: 150 bpm, mod variability, no accels, no decels Toco: none  Results for orders placed or performed during the hospital encounter of 07/19/19 (from the past 24 hour(s))  Urinalysis, Routine w reflex microscopic     Status: Abnormal   Collection Time: 07/19/19  9:54 AM  Result Value Ref Range   Color, Urine YELLOW YELLOW   APPearance HAZY (A) CLEAR   Specific Gravity, Urine 1.019 1.005 - 1.030   pH 6.0 5.0 - 8.0    Glucose, UA NEGATIVE NEGATIVE mg/dL   Hgb urine dipstick NEGATIVE NEGATIVE   Bilirubin Urine NEGATIVE NEGATIVE   Ketones, ur NEGATIVE NEGATIVE mg/dL   Protein, ur NEGATIVE NEGATIVE mg/dL   Nitrite NEGATIVE NEGATIVE   Leukocytes,Ua TRACE (A) NEGATIVE   RBC / HPF 0-5 0 - 5 RBC/hpf   WBC, UA 0-5 0 - 5 WBC/hpf   Bacteria, UA RARE (A) NONE SEEN   Squamous Epithelial / LPF 6-10 0 - 5   Mucus PRESENT   CBC     Status: Abnormal   Collection Time: 07/19/19 11:07 AM  Result Value Ref Range   WBC 7.2 4.0 - 10.5 K/uL   RBC 3.75 (L) 3.87 - 5.11 MIL/uL   Hemoglobin 11.3 (L) 12.0 - 15.0 g/dL   HCT 46.9 (L) 62.9 - 52.8 %   MCV 89.1 80.0 - 100.0 fL   MCH 30.1 26.0 - 34.0 pg   MCHC 33.8 30.0 - 36.0 g/dL   RDW 41.3 24.4 - 01.0 %   Platelets 154 150 - 400 K/uL   nRBC 0.0 0.0 - 0.2 %  Basic metabolic panel     Status: Abnormal   Collection Time: 07/19/19 11:07 AM  Result Value Ref Range   Sodium 136 135 - 145 mmol/L   Potassium 3.6 3.5 - 5.1 mmol/L   Chloride 105 98 - 111 mmol/L   CO2 22 22 - 32 mmol/L   Glucose, Bld 78 70 - 99 mg/dL   BUN 5 (L) 6 - 20 mg/dL   Creatinine, Ser 2.72 0.44 - 1.00 mg/dL   Calcium 9.0 8.9 - 53.6 mg/dL   GFR calc non Af Amer >60 >60 mL/min   GFR calc Af Amer >60 >60 mL/min   Anion gap 9 5 - 15  Wet prep, genital     Status: Abnormal   Collection Time: 07/19/19 11:36 AM   Specimen: PATH Cytology Cervicovaginal Ancillary Only  Result Value Ref Range   Yeast Wet Prep HPF POC NONE SEEN NONE SEEN   Trich, Wet Prep NONE SEEN NONE SEEN   Clue Cells Wet Prep HPF POC NONE SEEN NONE SEEN   WBC, Wet Prep HPF POC MODERATE (A) NONE SEEN   Sperm NONE SEEN    MAU Course  Procedures  MDM No prenatal record on file for review. Labs ordered and reviewed. No evidence of infection or PTL, GC still pending. Other sx likely caused from poor nutrition and hydration, discussed importance of good hydration and eat small meals, often. Advised against placing anything (even tissue)  in her nose as nose bleeds can be more common in pregnancy. Stable  for discharge home.   Assessment and Plan   1. [redacted] weeks gestation of pregnancy   2. Dehydration   3. Abdominal cramping affecting pregnancy    Discharge home Follow up at [facility] as scheduled PTL precautions Notify MD for worsening sx  Allergies as of 07/19/2019      Reactions   Latex    Itching with condoms      Medication List    You have not been prescribed any medications.    Julianne Handler, CNM 07/19/2019, 12:59 PM

## 2019-07-19 NOTE — Discharge Instructions (Signed)
Dehydration, Adult  Dehydration is when there is not enough fluid or water in your body. This happens when you lose more fluids than you take in. Dehydration can range from mild to very bad. It should be treated right away to keep it from getting very bad. Symptoms of mild dehydration may include:  Thirst.  Dry lips.  Slightly dry mouth.  Dry, warm skin.  Dizziness. Symptoms of moderate dehydration may include:  Very dry mouth.  Muscle cramps.  Dark pee (urine). Pee may be the color of tea.  Your body making less pee.  Your eyes making fewer tears.  Heartbeat that is uneven or faster than normal (palpitations).  Headache.  Light-headedness, especially when you stand up from sitting.  Fainting (syncope). Symptoms of very bad dehydration may include:  Changes in skin, such as: ? Cold and clammy skin. ? Blotchy (mottled) or pale skin. ? Skin that does not quickly return to normal after being lightly pinched and let go (poor skin turgor).  Changes in body fluids, such as: ? Feeling very thirsty. ? Your eyes making fewer tears. ? Not sweating when body temperature is high, such as in hot weather. ? Your body making very little pee.  Changes in vital signs, such as: ? Weak pulse. ? Pulse that is more than 100 beats a minute when you are sitting still. ? Fast breathing. ? Low blood pressure.  Other changes, such as: ? Sunken eyes. ? Cold hands and feet. ? Confusion. ? Lack of energy (lethargy). ? Trouble waking up from sleep. ? Short-term weight loss. ? Unconsciousness. Follow these instructions at home:   If told by your doctor, drink an ORS: ? Make an ORS by using instructions on the package. ? Start by drinking small amounts, about  cup (120 mL) every 5-10 minutes. ? Slowly drink more until you have had the amount that your doctor said to have.  Drink enough clear fluid to keep your pee clear or pale yellow. If you were told to drink an ORS, finish the  ORS first, then start slowly drinking clear fluids. Drink fluids such as: ? Water. Do not drink only water by itself. Doing that can make the salt (sodium) level in your body get too low (hyponatremia). ? Ice chips. ? Fruit juice that you have added water to (diluted). ? Low-calorie sports drinks.  Avoid: ? Alcohol. ? Drinks that have a lot of sugar. These include high-calorie sports drinks, fruit juice that does not have water added, and soda. ? Caffeine. ? Foods that are greasy or have a lot of fat or sugar.  Take over-the-counter and prescription medicines only as told by your doctor.  Do not take salt tablets. Doing that can make the salt level in your body get too high (hypernatremia).  Eat foods that have minerals (electrolytes). Examples include bananas, oranges, potatoes, tomatoes, and spinach.  Keep all follow-up visits as told by your doctor. This is important. Contact a doctor if:  You have belly (abdominal) pain that: ? Gets worse. ? Stays in one area (localizes).  You have a rash.  You have a stiff neck.  You get angry or annoyed more easily than normal (irritability).  You are more sleepy than normal.  You have a harder time waking up than normal.  You feel: ? Weak. ? Dizzy. ? Very thirsty.  You have peed (urinated) only a small amount of very dark pee during 6-8 hours. Get help right away if:  You have  symptoms of very bad dehydration.  You cannot drink fluids without throwing up (vomiting).  Your symptoms get worse with treatment.  You have a fever.  You have a very bad headache.  You are throwing up or having watery poop (diarrhea) and it: ? Gets worse. ? Does not go away.  You have blood or something green (bile) in your throw-up.  You have blood in your poop (stool). This may cause poop to look black and tarry.  You have not peed in 6-8 hours.  You pass out (faint).  Your heart rate when you are sitting still is more than 100 beats a  minute.  You have trouble breathing. This information is not intended to replace advice given to you by your health care provider. Make sure you discuss any questions you have with your health care provider. Document Released: 08/09/2009 Document Revised: 09/25/2017 Document Reviewed: 12/07/2015 Elsevier Patient Education  [place].   Abdominal Pain During Pregnancy  Belly (abdominal) pain is common during pregnancy. There are many possible causes. Most of the time, it is not a serious problem. Other times, it can be a sign that something is wrong with the pregnancy. Always tell your doctor if you have belly pain. Follow these instructions at home:  Do not have sex or put anything in your vagina until your pain goes away completely.  Get plenty of rest until your pain gets better.  Drink enough fluid to keep your pee (urine) pale yellow.  Take over-the-counter and prescription medicines only as told by your doctor.  Keep all follow-up visits as told by your doctor. This is important. Contact a doctor if:  Your pain continues or gets worse after resting.  You have lower belly pain that: ? Comes and goes at regular times. ? Spreads to your back. ? Feels like menstrual cramps.  You have pain or burning when you pee (urinate). Get help right away if:  You have a fever or chills.  You have vaginal bleeding.  You are leaking fluid from your vagina.  You are passing tissue from your vagina.  You throw up (vomit) for more than 24 hours.  You have watery poop (diarrhea) for more than 24 hours.  Your baby is moving less than usual.  You feel very weak or faint.  You have shortness of breath.  You have very bad pain in your upper belly. Summary  Belly (abdominal) pain is common during pregnancy. There are many possible causes.  If you have belly pain during pregnancy, tell your doctor right away.  Keep all follow-up visits as told by your doctor. This is  important. This information is not intended to replace advice given to you by your health care provider. Make sure you discuss any questions you have with your health care provider. Document Released: 10/01/2009 Document Revised: 01/31/2019 Document Reviewed: 01/15/2017 Elsevier Patient Education  2020 [place] for Pregnant Women While you are pregnant, your body requires additional nutrition to help support your growing baby. You also have a higher need for some vitamins and minerals, such as folic acid, calcium, iron, and vitamin D. Eating a healthy, well-balanced diet is very important for your health and your baby's health. Your need for extra calories varies for the three 63-month segments of your pregnancy (trimesters). For most women, it is recommended to consume:  150 extra calories a day during the first trimester.  300 extra calories a day during the second trimester.  [place]  extra calories a day during the third trimester. What are tips for following this plan?   Do not try to lose weight or go on a diet during pregnancy.  Limit your overall intake of foods that have "empty calories." These are foods that have little nutritional value, such as sweets, desserts, candies, and sugar-sweetened beverages.  Eat a variety of foods (especially fruits and vegetables) to get a full range of vitamins and minerals.  Take a prenatal vitamin to help meet your additional vitamin and mineral needs during pregnancy, specifically for folic acid, iron, calcium, and vitamin D.  Remember to stay active. Ask your health care provider what types of exercise and activities are safe for you.  Practice good food safety and cleanliness. Wash your hands before you eat and after you prepare raw meat. Wash all fruits and vegetables well before peeling or eating. Taking these actions can help to prevent food-borne illnesses that can be very dangerous to your baby, such as listeriosis. Ask your  health care provider for more information about listeriosis. What does 150 extra calories look like? Healthy options that provide 150 extra calories each day could be any of the following:  6-8 oz (170-230 g) of plain low-fat yogurt with  cup of berries.  1 apple with 2 teaspoons (11 g) of peanut butter.  Cut-up vegetables with  cup (60 g) of hummus.  8 oz (230 mL) or 1 cup of low-fat chocolate milk.  1 stick of string cheese with 1 medium orange.  1 peanut butter and jelly sandwich that is made with one slice of whole-wheat bread and 1 tsp (5 g) of peanut butter. For 300 extra calories, you could eat two of those healthy options each day. What is a healthy amount of weight to gain? The right amount of weight gain for you is based on your BMI before you became pregnant. If your BMI:  Was less than 18 (underweight), you should gain 28-40 lb (13-18 kg).  Was 18-24.9 (normal), you should gain 25-35 lb (11-16 kg).  Was 25-29.9 (overweight), you should gain 15-25 lb (7-11 kg).  Was 30 or greater (obese), you should gain 11-20 lb (5-9 kg). What if I am having twins or multiples? Generally, if you are carrying twins or multiples:  You may need to eat 300-600 extra calories a day.  The recommended range for total weight gain is 25-54 lb (11-25 kg), depending on your BMI before pregnancy.  Talk with your health care provider to find out about nutritional needs, weight gain, and exercise that is right for you. What foods can I eat?  Grains All grains. Choose whole grains, such as whole-wheat bread, oatmeal, or brown rice. Vegetables All vegetables. Eat a variety of colors and types of vegetables. Remember to wash your vegetables well before peeling or eating. Fruits All fruits. Eat a variety of colors and types of fruit. Remember to wash your fruits well before peeling or eating. Meats and other protein foods Lean meats, including chicken, [place], fish, and lean cuts of beef, veal,  or pork. If you eat fish or seafood, choose options that are higher in omega-3 fatty acids and lower in mercury, such as salmon, herring, mussels, trout, sardines, pollock, shrimp, crab, and lobster. Tofu. Tempeh. Beans. Eggs. Peanut butter and other nut butters. Make sure that all meats, poultry, and eggs are cooked to food-safe temperatures or "well-done." Two or more servings of fish are recommended each week in order to get the most benefits  from omega-3 fatty acids that are found in seafood. Choose fish that are lower in mercury. You can find more information online:  PumpkinSearch.com.ee Dairy Pasteurized milk and milk alternatives (such as almond milk). Pasteurized yogurt and pasteurized cheese. Cottage cheese. Sour cream. Beverages Water. Juices that contain 100% fruit juice or vegetable juice. Caffeine-free teas and decaffeinated coffee. Drinks that contain caffeine are okay to drink, but it is better to avoid caffeine. Keep your total caffeine intake to less than 200 mg each day (which is 12 oz or 355 mL of coffee, tea, or soda) or the limit as told by your health care provider. Fats and oils Fats and oils are okay to include in moderation. Sweets and desserts Sweets and desserts are okay to include in moderation. Seasoning and other foods All pasteurized condiments. The items listed above may not be a complete list of recommended foods and beverages. Contact your dietitian for more options. The items listed above may not be a complete list of foods and beverages [you/your child] can eat. Contact a dietitian for more information. What foods are not recommended? Vegetables Raw (unpasteurized) vegetable juices. Fruits Unpasteurized fruit juices. Meats and other protein foods Lunch meats, bologna, hot dogs, or other deli meats. (If you must eat those meats, reheat them until they are steaming hot.) Refrigerated pat, meat spreads from a meat counter, smoked seafood that is found in the  refrigerated section of a store. Raw or undercooked meats, poultry, and eggs. Raw fish, such as sushi or sashimi. Fish that have high mercury content, such as tilefish, shark, swordfish, and king mackerel. To learn more about mercury in fish, talk with your health care provider or look for online resources, such as:  PumpkinSearch.com.ee Dairy Raw (unpasteurized) milk and any foods that have raw milk in them. Soft cheeses, such as feta, queso blanco, queso fresco, Brie, Camembert cheeses, blue-veined cheeses, and Panela cheese (unless it is made with pasteurized milk, which must be stated on the label). Beverages Alcohol. Sugar-sweetened beverages, such as sodas, teas, or energy drinks. Seasoning and other foods Homemade fermented foods and drinks, such as pickles, sauerkraut, or kombucha drinks. (Store-bought pasteurized versions of these are okay.) Salads that are made in a store or deli, such as ham salad, chicken salad, egg salad, tuna salad, and seafood salad. The items listed above may not be a complete list of foods and beverages to avoid. Contact your dietitian for more information. The items listed above may not be a complete list of foods and beverages [you/your child] should avoid. Contact a dietitian for more information. Where to find more information To calculate the number of calories you need based on your height, weight, and activity level, you can use an online calculator such as:  PackageNews.is To calculate how much weight you should gain during pregnancy, you can use an online pregnancy weight gain calculator such as:  http://jones-berg.com/ Summary  While you are pregnant, your body requires additional nutrition to help support your growing baby.  Eat a variety of foods, especially fruits and vegetables to get a full range of vitamins and minerals.  Practice good food safety and cleanliness. Wash your hands before you eat and  after you prepare raw meat. Wash all fruits and vegetables well before peeling or eating. Taking these actions can help to prevent food-borne illnesses, such as listeriosis, that can be very dangerous to your baby.  Do not eat raw meat or fish. Do not eat fish that have high mercury content, such  as tilefish, shark, swordfish, and king mackerel. Do not eat unpasteurized (raw) dairy.  Take a prenatal vitamin to help meet your additional vitamin and mineral needs during pregnancy, specifically for folic acid, iron, calcium, and vitamin D. This information is not intended to replace advice given to you by your health care provider. Make sure you discuss any questions you have with your health care provider. Document Released: 07/28/2014 Document Revised: 02/03/2019 Document Reviewed: 07/10/2017 Elsevier Patient Education  2020 [employer].

## 2019-07-20 LAB — CERVICOVAGINAL ANCILLARY ONLY
Chlamydia: NEGATIVE
Neisseria Gonorrhea: NEGATIVE

## 2019-09-05 ENCOUNTER — Encounter (HOSPITAL_COMMUNITY): Payer: Self-pay | Admitting: *Deleted

## 2019-09-05 ENCOUNTER — Other Ambulatory Visit: Payer: Self-pay

## 2019-09-05 ENCOUNTER — Inpatient Hospital Stay (HOSPITAL_COMMUNITY)
Admission: AD | Admit: 2019-09-05 | Discharge: 2019-09-05 | Disposition: A | Discharge status: Home / Self Care | Payer: 59 | Attending: Obstetrics and Gynecology | Admitting: Obstetrics and Gynecology

## 2019-09-05 DIAGNOSIS — Z3A32 32 weeks gestation of pregnancy: Secondary | ICD-10-CM | POA: Diagnosis not present

## 2019-09-05 DIAGNOSIS — Z0371 Encounter for suspected problem with amniotic cavity and membrane ruled out: Secondary | ICD-10-CM

## 2019-09-05 DIAGNOSIS — Z3A35 35 weeks gestation of pregnancy: Secondary | ICD-10-CM

## 2019-09-05 DIAGNOSIS — O4693 Antepartum hemorrhage, unspecified, third trimester: Secondary | ICD-10-CM | POA: Insufficient documentation

## 2019-09-05 DIAGNOSIS — Z3689 Encounter for other specified antenatal screening: Secondary | ICD-10-CM

## 2019-09-05 LAB — WET PREP, GENITAL
Clue Cells Wet Prep HPF POC: NONE SEEN
Sperm: NONE SEEN
Trich, Wet Prep: NONE SEEN
Yeast Wet Prep HPF POC: NONE SEEN

## 2019-09-05 LAB — URINALYSIS, ROUTINE W REFLEX MICROSCOPIC
Bilirubin Urine: NEGATIVE
Glucose, UA: NEGATIVE mg/dL
Hgb urine dipstick: NEGATIVE
Ketones, ur: 20 mg/dL — AB
Leukocytes,Ua: NEGATIVE
Nitrite: NEGATIVE
Protein, ur: 30 mg/dL — AB
Specific Gravity, Urine: 1.018 (ref 1.005–1.030)
pH: 6 (ref 5.0–8.0)

## 2019-09-05 LAB — AMNISURE RUPTURE OF MEMBRANE (ROM) NOT AT ARMC: Amnisure ROM: NEGATIVE

## 2019-09-05 MED ORDER — ACETAMINOPHEN 500 MG PO TABS
1000.0000 mg | ORAL_TABLET | Freq: Once | ORAL | Status: AC
  Administered 2019-09-05: 1000 mg via ORAL
  Filled 2019-09-05: qty 2

## 2019-09-05 MED ORDER — TERCONAZOLE 0.8 % VA CREA: 1.0000 | TOPICAL_CREAM | Freq: Every day | VAGINAL | 0 refills | Status: DC

## 2019-09-05 MED ORDER — CYCLOBENZAPRINE HCL 10 MG PO TABS
10.0000 mg | ORAL_TABLET | Freq: Once | ORAL | Status: AC
  Administered 2019-09-05: 17:00:00 10 mg via ORAL
  Filled 2019-09-05: qty 1

## 2019-09-05 NOTE — Discharge Instructions (Signed)

## 2019-09-05 NOTE — MAU Provider Note (Signed)
History     CSN: [id number]  Arrival date and time: 09/05/19 1412   First Provider Initiated Contact with Patient 09/05/19 1630      Chief Complaint  Patient presents with  . Vaginal Bleeding  . Abdominal Pain   HPI Melanie Valencia is a 23 y.o. G2P0010 at [redacted]w[redacted]d who presents to MAU with chief complaint of abnormal vaginal discharge and concern for PROM. She states around lunchtime today she was voiding, saw brown-tinged then red vaginal discharge. As she left the restroom she noticed that her pants were saturated. She denies overt vaginal bleeding, leaking of fluid, decreased fetal movement, fever, falls, or recent illness.   Patient also mentions recurrent abdominal cramps which she has experienced throughout third trimester. She denies contraction pain and states her pain score is 0/10 upon initial CNM introduction.  Patient receives prenatal care with [facility].  OB History    Gravida  2   Para      Term      Preterm      AB  1   Living        SAB  1   TAB      Ectopic      Multiple      Live Births              Past Medical History:  Diagnosis Date  . Anxiety    denies  . Infection    UTI    Past Surgical History:  Procedure Laterality Date  . NO PAST SURGERIES      Family History  Problem Relation Age of Onset  . Cancer Mother        liver  . Asthma Father     Social History   Tobacco Use  . Smoking status: Never Smoker  . Smokeless tobacco: Never Used  Substance Use Topics  . Alcohol use: Not Currently  . Drug use: No    Allergies:  Allergies  Allergen Reactions  . Latex     Itching with condoms    No medications prior to admission.    Review of Systems  Respiratory: Negative for shortness of breath.   Gastrointestinal: Negative for abdominal pain.  Genitourinary: Positive for vaginal discharge. Negative for difficulty urinating, dysuria, flank pain and vaginal bleeding.  Musculoskeletal: Negative for back pain.  All other  systems reviewed and are negative.  Physical Exam   Blood pressure 126/62, pulse (!) 111, temperature 98.3 F (36.8 C), temperature source Oral, resp. rate 16, weight 85.9 kg, last menstrual period 01/20/2019, SpO2 98 %, unknown if currently breastfeeding.  Physical Exam  Nursing note and vitals reviewed. Constitutional: She is oriented to person, place, and time. She appears well-developed and well-nourished.  Cardiovascular: Normal rate.  Respiratory: Effort normal and breath sounds normal.  GI: She exhibits no distension. There is no abdominal tenderness. There is no rebound, no guarding and no CVA tenderness.  Gravid  Genitourinary:    Genitourinary Comments: Thick white discharge throughout vaginal vault. No pooling, Cervix visually closed. No fern. Closed cervix confirmed with digital exam.    Neurological: She is alert and oriented to person, place, and time.  Skin: Skin is warm and dry.  Psychiatric: She has a normal mood and affect. Her behavior is normal. Judgment and thought content normal.    MAU Course/MDM  Procedures: sterile speculum exam  --Patient endorses new onset low back pain at 1640. Tylenol and Flexeril ordered. Patient denies pain at discharge --Treat presumptively for yeast  infection based on physical exam --Reactive tracing: baseline 140, moderate variability, positive accels, no decels --Toco: rare  Patient Vitals for the past 24 hrs:  BP Temp Temp src Pulse Resp SpO2 Weight  09/05/19 1739 126/62 - - (!) 111 16 - -  09/05/19 1435 131/67 98.3 F (36.8 C) Oral (!) 105 18 98 % 85.9 kg   Results for orders placed or performed during the hospital encounter of 09/05/19 (from the past 24 hour(s))  Amnisure rupture of membrane (rom)     Status: None   Collection Time: 09/05/19  3:05 PM  Result Value Ref Range   Amnisure ROM NEGATIVE   Wet prep, genital     Status: Abnormal   Collection Time: 09/05/19  3:05 PM  Result Value Ref Range   Yeast Wet Prep HPF  POC NONE SEEN NONE SEEN   Trich, Wet Prep NONE SEEN NONE SEEN   Clue Cells Wet Prep HPF POC NONE SEEN NONE SEEN   WBC, Wet Prep HPF POC MODERATE (A) NONE SEEN   Sperm NONE SEEN   Urinalysis, Routine w reflex microscopic     Status: Abnormal   Collection Time: 09/05/19  3:46 PM  Result Value Ref Range   Color, Urine YELLOW YELLOW   APPearance HAZY (A) CLEAR   Specific Gravity, Urine 1.018 1.005 - 1.030   pH 6.0 5.0 - 8.0   Glucose, UA NEGATIVE NEGATIVE mg/dL   Hgb urine dipstick NEGATIVE NEGATIVE   Bilirubin Urine NEGATIVE NEGATIVE   Ketones, ur 20 (A) NEGATIVE mg/dL   Protein, ur 30 (A) NEGATIVE mg/dL   Nitrite NEGATIVE NEGATIVE   Leukocytes,Ua NEGATIVE NEGATIVE   RBC / HPF 0-5 0 - 5 RBC/hpf   WBC, UA 0-5 0 - 5 WBC/hpf   Bacteria, UA RARE (A) NONE SEEN   Squamous Epithelial / LPF 0-5 0 - 5   Mucus PRESENT    Meds ordered this encounter  Medications  . acetaminophen (TYLENOL) tablet 1,000 mg  . cyclobenzaprine (FLEXERIL) tablet 10 mg  . terconazole (TERAZOL 3) 0.8 % vaginal cream    Sig: Place 1 applicator vaginally at bedtime. Apply nightly for three nights.    Dispense:  20 g    Refill:  0    Order Specific Question:   Supervising Provider    Answer:   Aletha Halim [[id number]]   Assessment and Plan  --23 y.o. G2P0010 at [redacted]w[redacted]d  --Reactive tracing, closed cervix --Intact amniotic sac --Treat for yeast --B Neg, Rhogam given in clinic on 10/30 --Discharge home in stable condition  F/U: --[facility] PRN  Darlina Rumpf, CNM 09/05/2019, 5:50 PM

## 2019-09-05 NOTE — MAU Note (Addendum)
Was at work, went to the bathroom. Noted some brownish d/c, when wiped she saw red.  When she went to leave, her pants felt wet, doesn't know if more bleeding or water leaking.  Felt a little crampy earlier, not now. No prior bleeding, no placental concerns on [place]. Couple wks ago had diarrhea, started using baby wipes- broke out from them.  Lots of bumps and irritation on peri area.

## 2019-09-06 LAB — GC/CHLAMYDIA PROBE AMP (~~LOC~~) NOT AT ARMC
Chlamydia: NEGATIVE
Comment: NEGATIVE
Comment: NORMAL
Neisseria Gonorrhea: NEGATIVE

## 2019-10-01 ENCOUNTER — Inpatient Hospital Stay (HOSPITAL_COMMUNITY)
Admission: AD | Admit: 2019-10-01 | Discharge: 2019-10-02 | Disposition: A | Discharge status: Home / Self Care | Payer: 59 | Attending: Obstetrics and Gynecology | Admitting: Obstetrics and Gynecology

## 2019-10-01 ENCOUNTER — Other Ambulatory Visit: Payer: Self-pay

## 2019-10-01 ENCOUNTER — Encounter (HOSPITAL_COMMUNITY): Payer: Self-pay

## 2019-10-01 DIAGNOSIS — Z3A36 36 weeks gestation of pregnancy: Secondary | ICD-10-CM | POA: Insufficient documentation

## 2019-10-01 DIAGNOSIS — Z0371 Encounter for suspected problem with amniotic cavity and membrane ruled out: Secondary | ICD-10-CM

## 2019-10-01 NOTE — MAU Note (Signed)
Pt reports to [facility] for possible ROM, She thinks her water may have broken around 11pm, denies bleeding, +fetal movement.

## 2019-10-02 DIAGNOSIS — Z3A36 36 weeks gestation of pregnancy: Secondary | ICD-10-CM

## 2019-10-02 DIAGNOSIS — Z0371 Encounter for suspected problem with amniotic cavity and membrane ruled out: Secondary | ICD-10-CM

## 2019-10-02 LAB — URINALYSIS, ROUTINE W REFLEX MICROSCOPIC
Bilirubin Urine: NEGATIVE
Glucose, UA: NEGATIVE mg/dL
Hgb urine dipstick: NEGATIVE
Ketones, ur: NEGATIVE mg/dL
Nitrite: NEGATIVE
Protein, ur: NEGATIVE mg/dL
Specific Gravity, Urine: 1.002 — ABNORMAL LOW (ref 1.005–1.030)
pH: 7 (ref 5.0–8.0)

## 2019-10-02 LAB — POCT FERN TEST: POCT Fern Test: NEGATIVE

## 2019-10-02 NOTE — MAU Provider Note (Signed)
First Provider Initiated Contact with Patient 10/02/19 0013       S: Ms. Melanie Valencia is a 23 y.o. G2P0010 at [redacted]w[redacted]d  who presents to MAU today complaining of leaking of fluid since 11 pm. She denies vaginal bleeding. She denies contractions. She reports normal fetal movement.    O: BP 132/74   Pulse (!) 119   Temp 98.7 F (37.1 C)   Resp 16   Ht 5\' 4"  (1.626 m)   Wt 87.3 kg   LMP 01/20/2019 Comment: First period after SAB. Not Normal.   SpO2 98%   BMI 33.03 kg/m  GENERAL: Well-developed, well-nourished female in no acute distress.  HEAD: Normocephalic, atraumatic.  CHEST: Normal effort of breathing, regular heart rate ABDOMEN: Soft, nontender, gravid PELVIC: Normal external female genitalia. Vagina is pink and rugated. Cervix with normal contour, no lesions. Normal discharge.  No pooling.   Cervical exam: deferred   Fetal Monitoring: Baseline: 130 Variability: moderate Accelerations: 15x15 Decelerations: none Contractions: none  Results for orders placed or performed during the hospital encounter of 10/01/19 (from the past 24 hour(s))  POCT fern test     Status: Normal   Collection Time: 10/02/19 12:25 AM  Result Value Ref Range   POCT Fern Test Negative = intact amniotic membranes      A: [facility] at [redacted]w[redacted]d  Membranes intact  P: Discharge home in stable condition  F/u with [facility] as scheduled Discussed reasons to return to MAU  Jorje Guild, NP 10/02/2019 12:47 AM

## 2019-10-02 NOTE — Discharge Instructions (Signed)
Fetal Movement Counts Patient Name: ________________________________________________ Patient Due Date: ____________________ What is a fetal movement count?  A fetal movement count is the number of times that you feel your baby move during a certain amount of time. This may also be called a fetal kick count. A fetal movement count is recommended for every pregnant woman. You may be asked to start counting fetal movements as early as week 28 of your pregnancy. Pay attention to when your baby is most active. You may notice your baby's sleep and wake cycles. You may also notice things that make your baby move more. You should do a fetal movement count:  When your baby is normally most active.  At the same time each day. A good time to count movements is while you are resting, after having something to eat and drink. How do I count fetal movements? 1. Find a quiet, comfortable area. Sit, or lie down on your side. 2. Write down the date, the start time and stop time, and the number of movements that you felt between those two times. Take this information with you to your health care visits. 3. For 2 hours, count kicks, flutters, swishes, rolls, and jabs. You should feel at least 10 movements during 2 hours. 4. You may stop counting after you have felt 10 movements. 5. If you do not feel 10 movements in 2 hours, have something to eat and drink. Then, keep resting and counting for 1 hour. If you feel at least 4 movements during that hour, you may stop counting. Contact a health care provider if:  You feel fewer than 4 movements in 2 hours.  Your baby is not moving like he or she usually does. Date: ____________ Start time: ____________ Stop time: ____________ Movements: ____________ Date: ____________ Start time: ____________ Stop time: ____________ Movements: ____________ Date: ____________ Start time: ____________ Stop time: ____________ Movements: ____________ Date: ____________ Start time:  ____________ Stop time: ____________ Movements: ____________ Date: ____________ Start time: ____________ Stop time: ____________ Movements: ____________ Date: ____________ Start time: ____________ Stop time: ____________ Movements: ____________ Date: ____________ Start time: ____________ Stop time: ____________ Movements: ____________ Date: ____________ Start time: ____________ Stop time: ____________ Movements: ____________ Date: ____________ Start time: ____________ Stop time: ____________ Movements: ____________ This information is not intended to replace advice given to you by your health care provider. Make sure you discuss any questions you have with your health care provider. Document Released: 11/12/2006 Document Revised: 11/02/2018 Document Reviewed: 11/22/2015 Elsevier Patient Education  2020 [employer] Signs and Symptoms of Labor Labor is your body's natural process of moving your baby, placenta, and umbilical cord out of your uterus. The process of labor usually starts when your baby is full-term, between 37 and 40 weeks of pregnancy. How will I know when I am close to going into labor? As your body prepares for labor and the birth of your baby, you may notice the following symptoms in the weeks and days before true labor starts:  Having a strong desire to get your home ready to receive your new baby. This is called nesting. Nesting may be a sign that labor is approaching, and it may occur several weeks before birth. Nesting may involve cleaning and organizing your home.  Passing a small amount of thick, bloody mucus out of your vagina (normal bloody show or losing your mucus plug). This may happen more than a week before labor begins, or it might occur right before labor begins as the opening of the cervix starts   to widen (dilate). For some women, the entire mucus plug passes at once. For others, smaller portions of the mucus plug may gradually pass over several days.  Your baby  moving (dropping) lower in your pelvis to get into position for birth (lightening). When this happens, you may feel more pressure on your bladder and pelvic bone and less pressure on your ribs. This may make it easier to breathe. It may also cause you to need to urinate more often and have problems with bowel movements.  Having "practice contractions" (Braxton Hicks contractions) that occur at irregular (unevenly spaced) intervals that are more than 10 minutes apart. This is also called false labor. False labor contractions are common after exercise or sexual activity, and they will stop if you change position, rest, or drink fluids. These contractions are usually mild and do not get stronger over time. They may feel like: ? A backache or back pain. ? Mild cramps, similar to menstrual cramps. ? Tightening or pressure in your abdomen. Other early symptoms that labor may be starting soon include:  Nausea or loss of appetite.  Diarrhea.  Having a sudden burst of energy, or feeling very tired.  Mood changes.  Having trouble sleeping. How will I know when labor has begun? Signs that true labor has begun may include:  Having contractions that come at regular (evenly spaced) intervals and increase in intensity. This may feel like more intense tightening or pressure in your abdomen that moves to your back. ? Contractions may also feel like rhythmic pain in your upper thighs or back that comes and goes at regular intervals. ? For first-time mothers, this change in intensity of contractions often occurs at a more gradual pace. ? Women who have given birth before may notice a more rapid progression of contraction changes.  Having a feeling of pressure in the vaginal area.  Your water breaking (rupture of membranes). This is when the sac of fluid that surrounds your baby breaks. When this happens, you will notice fluid leaking from your vagina. This may be clear or blood-tinged. Labor usually starts  within 24 hours of your water breaking, but it may take longer to begin. ? Some women notice this as a gush of fluid. ? Others notice that their underwear repeatedly becomes damp. Follow these instructions at home:   When labor starts, or if your water breaks, call your health care provider or nurse care line. Based on your situation, they will determine when you should go in for an exam.  When you are in early labor, you may be able to rest and manage symptoms at home. Some strategies to try at home include: ? Breathing and relaxation techniques. ? Taking a warm bath or shower. ? Listening to music. ? Using a heating pad on the lower back for pain. If you are directed to use heat:  Place a towel between your skin and the heat source.  Leave the heat on for 20-30 minutes.  Remove the heat if your skin turns bright red. This is especially important if you are unable to feel pain, heat, or cold. You may have a greater risk of getting burned. Get help right away if:  You have painful, regular contractions that are 5 minutes apart or less.  Labor starts before you are [redacted] weeks along in your pregnancy.  You have a fever.  You have a headache that does not go away.  You have bright red blood coming from your vagina.  You   do not feel your baby moving.  You have a sudden onset of: ? Severe headache with vision problems. ? Nausea, vomiting, or diarrhea. ? Chest pain or shortness of breath. These symptoms may be an emergency. If your health care provider recommends that you go to the hospital or birth center where you plan to deliver, do not drive yourself. Have someone else drive you, or call emergency services (911 in the [place] Summary  Labor is your body's natural process of moving your baby, placenta, and umbilical cord out of your uterus.  The process of labor usually starts when your baby is full-term, between 37 and 40 weeks of pregnancy.  When labor starts, or if your water  breaks, call your health care provider or nurse care line. Based on your situation, they will determine when you should go in for an exam. This information is not intended to replace advice given to you by your health care provider. Make sure you discuss any questions you have with your health care provider. Document Released: 03/20/2017 Document Revised: 07/13/2017 Document Reviewed: 03/20/2017 Elsevier Patient Education  2020 Elsevier Inc.  

## 2019-10-11 LAB — OB RESULTS CONSOLE GBS: GBS: POSITIVE

## 2019-10-25 ENCOUNTER — Encounter (HOSPITAL_COMMUNITY): Payer: Self-pay | Admitting: *Deleted

## 2019-10-25 ENCOUNTER — Telehealth (HOSPITAL_COMMUNITY): Payer: Self-pay | Admitting: *Deleted

## 2019-10-25 NOTE — Telephone Encounter (Signed)
Preadmission screen  

## 2019-10-28 NOTE — L&D Delivery Note (Signed)
Delivery Note Melanie Valencia is a G1P1 admitted for IOL at 41.0 weeks. Cytotec given x 2 doses for cervical ripening. Pitocin augmentation to follow with AROM at 1650. At 9:42 PM a viable vigorous female was delivered via SVD.  APGAR: 8, 9; weight pending. Dr. Charlotta Newton present at bedside for observation and suspected LGA.  Placenta status: Spontaneous, Intact.  Cord: 3 vessels with the following complications PPH, terminal meconium. Cytotec given rectally. Small clots expelled. Fundus now firm. PPH due partially to laceration. Now hemostatic once repaired.   Anesthesia: Epidural Episiotomy: None Lacerations: 2nd degree;Periurethral repaired in usual fashion Suture Repair: 3.0 vicryl Est. Blood Loss (mL): 1137 CBCD in AM  Mom to postpartum.  Baby to Couplet care / Skin to Skin.  Melanie Valencia 11/03/2019, 10:21 PM

## 2019-11-01 ENCOUNTER — Other Ambulatory Visit (HOSPITAL_COMMUNITY)
Admission: RE | Admit: 2019-11-01 | Discharge: 2019-11-01 | Disposition: A | Discharge status: Home / Self Care | Payer: Managed Care, Other (non HMO) | Source: Ambulatory Visit | Attending: Obstetrics & Gynecology | Admitting: Obstetrics & Gynecology

## 2019-11-01 ENCOUNTER — Other Ambulatory Visit: Payer: Self-pay | Admitting: Obstetrics & Gynecology

## 2019-11-01 DIAGNOSIS — Z01812 Encounter for preprocedural laboratory examination: Secondary | ICD-10-CM | POA: Insufficient documentation

## 2019-11-01 DIAGNOSIS — Z20822 Contact with and (suspected) exposure to covid-19: Secondary | ICD-10-CM | POA: Insufficient documentation

## 2019-11-01 LAB — SARS CORONAVIRUS 2 (TAT 6-24 HRS): SARS Coronavirus 2: NEGATIVE

## 2019-11-02 DIAGNOSIS — O99019 Anemia complicating pregnancy, unspecified trimester: Secondary | ICD-10-CM | POA: Diagnosis present

## 2019-11-02 DIAGNOSIS — O99820 Streptococcus B carrier state complicating pregnancy: Secondary | ICD-10-CM

## 2019-11-02 DIAGNOSIS — D563 Thalassemia minor: Secondary | ICD-10-CM | POA: Diagnosis present

## 2019-11-02 DIAGNOSIS — Z3490 Encounter for supervision of normal pregnancy, unspecified, unspecified trimester: Secondary | ICD-10-CM | POA: Diagnosis not present

## 2019-11-02 NOTE — H&P (Signed)
OB ADMISSION/ HISTORY & PHYSICAL:  Admission Date: 11/03/2019  2:47 AM  Admit Diagnosis: IOL for postdates  Melanie Valencia is a 24 y.o. female G2P0010 [redacted]w[redacted]d presenting for IOL. Endorses active FM, denies LOF and vaginal bleeding.   History of current pregnancy: G2P0010   Patient entered care with [facility] at 9+5 wks.   EDC of 10/27/19 was established by LMP and congruent w/ 9+4 wk U/S.   Anatomy scan:  21 wks, with normal findings and posterior placenta.   Antenatal testing: for possible macrosomia, 94%ile @ 29wks, growth [place] every 4 wks Last evaluation: 37+1 wks  vertex, normal AFI, 7# 10 oz, 85%ile  Significant prenatal events: anemia Hgb 9.2 @ 37 wks, alpha thalassemia carrier, thrombocytopenic disorder plt 140 @ 37 wks Patient Active Problem List   Diagnosis Date Noted  . Post-dates pregnancy 11/03/2019  . GBS (group B Streptococcus carrier), +RV culture, currently pregnant 11/02/2019  . Anemia of pregnancy 11/02/2019  . Alpha thalassemia trait 11/02/2019  . Encounter for planned induction of labor 11/02/2019    Prenatal Labs: ABO, Rh: --/--/B NEG Performed at [facility], [address], [place], [place] [address] , B NEG (07/22 1939) Antibody: NEG (07/22 1939) Rubella:   Immune RPR:   non-reactive HBsAg:   negative HIV:   non-reactive GTT: passed 1 hour screening GBS:  positive GC/CHL: negative/negative Genetics: low-risk female    OB History  Gravida Para Term Preterm AB Living  2       1    SAB TAB Ectopic Multiple Live Births  1            # Outcome Date GA Lbr Len/2nd Weight Sex Delivery Anes PTL Lv  2 Current           1 SAB 12/22/18 [redacted]w[redacted]d           Medical / Surgical History: Past medical history:  Past Medical History:  Diagnosis Date  . Anxiety    denies  . Infection    UTI    Past surgical history:  Past Surgical History:  Procedure Laterality Date  . NO PAST SURGERIES     Family History:  Family History  Problem Relation Age of Onset  .  Cancer Mother        liver  . Asthma Father     Social History:  reports that she has never smoked. She has never used smokeless tobacco. She reports previous alcohol use. She reports that she does not use drugs.  Allergies: Latex   Current Medications at time of admission:  Prior to Admission medications   Medication Sig Start Date End Date Taking? Authorizing Provider  Prenatal Vit-Fe Fumarate-FA (MULTIVITAMIN-PRENATAL) 27-0.8 MG TABS tablet Take 1 tablet by mouth daily at 12 noon.    [provider]    Review of Systems: Constitutional: Negative   HENT: Negative   Eyes: Negative   Respiratory: Negative   Cardiovascular: Negative   Gastrointestinal: Negative  Genitourinary: negative for bloody show, negative for LOF   Musculoskeletal: Negative   Skin: Negative   Neurological: Negative   Endo/Heme/Allergies: Negative   Psychiatric/Behavioral: Negative    Physical Exam: VS: Last menstrual period 01/20/2019, unknown if currently breastfeeding. AAO x3, no signs of distress Cardiovascular: RRR Respiratory: Lung fields clear to ausculation GU/GI: Abdomen gravid, non-tender, non-distended, active FM, vertex, EFW 7# per Leopold's Extremities: no edema, negative for pain, tenderness, and cords  Cervical exam:  FHR: baseline rate 135 / variability moderate /  accelerations present / absent decelerations TOCO: 4-6 min, mild   Prenatal Transfer Tool  Maternal Diabetes: No Genetic Screening: Normal Maternal Ultrasounds/Referrals: Normal Fetal Ultrasounds or other Referrals:  None Maternal Substance Abuse:  No Significant Maternal Medications:  None Significant Maternal Lab Results: Group B Strep positive    Assessment: 24 y.o. G2P0010 [redacted]w[redacted]d IOL for postdates   Anemia Thrombocytopenic disorder Cytotec induction FHR category 1 GBS positive    -PCN prophylaxis Pain management plan: epidural   Plan:  Admit to L&D Routine admission orders Epidural  PRN Active management of 3rd stage to minimize bleeding Dr Alesia Richards to be notified of admission and plan of care  Arrie Eastern CNM, MSN 11/02/2019 11:26 PM

## 2019-11-03 ENCOUNTER — Inpatient Hospital Stay (HOSPITAL_COMMUNITY): Payer: Managed Care, Other (non HMO) | Admitting: Anesthesiology

## 2019-11-03 ENCOUNTER — Encounter (HOSPITAL_COMMUNITY): Payer: Self-pay | Admitting: Obstetrics & Gynecology

## 2019-11-03 ENCOUNTER — Other Ambulatory Visit: Payer: Self-pay

## 2019-11-03 ENCOUNTER — Inpatient Hospital Stay (HOSPITAL_COMMUNITY)
Admission: AD | Admit: 2019-11-03 | Discharge: 2019-11-05 | DRG: 806 | Disposition: A | Discharge status: Home / Self Care | Payer: Managed Care, Other (non HMO) | Attending: Obstetrics & Gynecology | Admitting: Obstetrics & Gynecology

## 2019-11-03 ENCOUNTER — Inpatient Hospital Stay (HOSPITAL_COMMUNITY): Payer: Managed Care, Other (non HMO)

## 2019-11-03 DIAGNOSIS — O99013 Anemia complicating pregnancy, third trimester: Secondary | ICD-10-CM

## 2019-11-03 DIAGNOSIS — O99824 Streptococcus B carrier state complicating childbirth: Secondary | ICD-10-CM | POA: Diagnosis present

## 2019-11-03 DIAGNOSIS — Z3A41 41 weeks gestation of pregnancy: Secondary | ICD-10-CM | POA: Diagnosis not present

## 2019-11-03 DIAGNOSIS — Z3490 Encounter for supervision of normal pregnancy, unspecified, unspecified trimester: Secondary | ICD-10-CM

## 2019-11-03 DIAGNOSIS — O26893 Other specified pregnancy related conditions, third trimester: Secondary | ICD-10-CM | POA: Diagnosis present

## 2019-11-03 DIAGNOSIS — O9081 Anemia of the puerperium: Secondary | ICD-10-CM | POA: Diagnosis not present

## 2019-11-03 DIAGNOSIS — O99820 Streptococcus B carrier state complicating pregnancy: Secondary | ICD-10-CM

## 2019-11-03 DIAGNOSIS — Z6791 Unspecified blood type, Rh negative: Secondary | ICD-10-CM

## 2019-11-03 DIAGNOSIS — D696 Thrombocytopenia, unspecified: Secondary | ICD-10-CM | POA: Diagnosis present

## 2019-11-03 DIAGNOSIS — D563 Thalassemia minor: Secondary | ICD-10-CM | POA: Diagnosis present

## 2019-11-03 DIAGNOSIS — O9912 Other diseases of the blood and blood-forming organs and certain disorders involving the immune mechanism complicating childbirth: Secondary | ICD-10-CM | POA: Diagnosis present

## 2019-11-03 DIAGNOSIS — O480 Post-term pregnancy: Principal | ICD-10-CM | POA: Diagnosis present

## 2019-11-03 DIAGNOSIS — O99019 Anemia complicating pregnancy, unspecified trimester: Secondary | ICD-10-CM | POA: Diagnosis present

## 2019-11-03 DIAGNOSIS — Z20822 Contact with and (suspected) exposure to covid-19: Secondary | ICD-10-CM | POA: Diagnosis present

## 2019-11-03 HISTORY — DX: Thalassemia minor: D56.3

## 2019-11-03 HISTORY — DX: Anemia, unspecified: D64.9

## 2019-11-03 LAB — CBC
HCT: 34.9 % — ABNORMAL LOW (ref 36.0–46.0)
HCT: 30.7 % — ABNORMAL LOW (ref 36.0–46.0)
Hemoglobin: 10.7 g/dL — ABNORMAL LOW (ref 12.0–15.0)
Hemoglobin: 9.6 g/dL — ABNORMAL LOW (ref 12.0–15.0)
MCH: 25.2 pg — ABNORMAL LOW (ref 26.0–34.0)
MCH: 25.5 pg — ABNORMAL LOW (ref 26.0–34.0)
MCHC: 30.7 g/dL (ref 30.0–36.0)
MCHC: 31.3 g/dL (ref 30.0–36.0)
MCV: 82.3 fL (ref 80.0–100.0)
MCV: 81.4 fL (ref 80.0–100.0)
Platelets: 146 10*3/uL — ABNORMAL LOW (ref 150–400)
Platelets: 147 10*3/uL — ABNORMAL LOW (ref 150–400)
RBC: 4.24 MIL/uL (ref 3.87–5.11)
RBC: 3.77 MIL/uL — ABNORMAL LOW (ref 3.87–5.11)
RDW: 17.6 % — ABNORMAL HIGH (ref 11.5–15.5)
RDW: 17.4 % — ABNORMAL HIGH (ref 11.5–15.5)
WBC: 5.9 10*3/uL (ref 4.0–10.5)
WBC: 12.5 10*3/uL — ABNORMAL HIGH (ref 4.0–10.5)
nRBC: 0 % (ref 0.0–0.2)
nRBC: 0 % (ref 0.0–0.2)

## 2019-11-03 LAB — TYPE AND SCREEN
ABO/RH(D): B NEG
Antibody Screen: NEGATIVE

## 2019-11-03 MED ORDER — SODIUM CHLORIDE 0.9 % IV SOLN
5.0000 10*6.[IU] | Freq: Once | INTRAVENOUS | Status: AC
  Administered 2019-11-03: 5 10*6.[IU] via INTRAVENOUS
  Filled 2019-11-03: qty 5

## 2019-11-03 MED ORDER — LACTATED RINGERS IV SOLN: 500.0000 mL | INTRAVENOUS | Status: DC | PRN

## 2019-11-03 MED ORDER — MISOPROSTOL 200 MCG PO TABS
ORAL_TABLET | ORAL | Status: AC
  Filled 2019-11-03: qty 5

## 2019-11-03 MED ORDER — OXYTOCIN 40 UNITS IN NORMAL SALINE INFUSION - SIMPLE MED
1.0000 m[IU]/min | INTRAVENOUS | Status: DC
  Administered 2019-11-03: 2 m[IU]/min via INTRAVENOUS
  Filled 2019-11-03: qty 1000

## 2019-11-03 MED ORDER — OXYTOCIN BOLUS FROM INFUSION
500.0000 mL | Freq: Once | INTRAVENOUS | Status: AC
  Administered 2019-11-03: 500 mL via INTRAVENOUS

## 2019-11-03 MED ORDER — LIDOCAINE HCL (PF) 1 % IJ SOLN
INTRAMUSCULAR | Status: DC | PRN
  Administered 2019-11-03: 5 mL via EPIDURAL

## 2019-11-03 MED ORDER — LACTATED RINGERS IV SOLN: INTRAVENOUS | Status: DC

## 2019-11-03 MED ORDER — EPHEDRINE 5 MG/ML INJ: 10.0000 mg | INTRAVENOUS | Status: DC | PRN

## 2019-11-03 MED ORDER — DIPHENHYDRAMINE HCL 50 MG/ML IJ SOLN: 12.5000 mg | INTRAMUSCULAR | Status: DC | PRN

## 2019-11-03 MED ORDER — LIDOCAINE HCL (PF) 1 % IJ SOLN
30.0000 mL | INTRAMUSCULAR | Status: AC | PRN
  Administered 2019-11-03: 30 mL via SUBCUTANEOUS
  Filled 2019-11-03: qty 30

## 2019-11-03 MED ORDER — TERBUTALINE SULFATE 1 MG/ML IJ SOLN: 0.2500 mg | Freq: Once | INTRAMUSCULAR | Status: DC | PRN

## 2019-11-03 MED ORDER — OXYTOCIN 40 UNITS IN NORMAL SALINE INFUSION - SIMPLE MED: 2.5000 [IU]/h | INTRAVENOUS | Status: DC

## 2019-11-03 MED ORDER — PENICILLIN G POT IN DEXTROSE 60000 UNIT/ML IV SOLN
3.0000 10*6.[IU] | INTRAVENOUS | Status: DC
  Administered 2019-11-03: 3 10*6.[IU] via INTRAVENOUS
  Filled 2019-11-03: qty 50

## 2019-11-03 MED ORDER — SODIUM CHLORIDE (PF) 0.9 % IJ SOLN
INTRAMUSCULAR | Status: DC | PRN
  Administered 2019-11-03: 12 mL/h via EPIDURAL

## 2019-11-03 MED ORDER — LACTATED RINGERS IV SOLN
500.0000 mL | Freq: Once | INTRAVENOUS | Status: AC
  Administered 2019-11-03: 500 mL via INTRAVENOUS

## 2019-11-03 MED ORDER — FENTANYL-BUPIVACAINE-NACL 0.5-0.125-0.9 MG/250ML-% EP SOLN
12.0000 mL/h | EPIDURAL | Status: DC | PRN
  Filled 2019-11-03: qty 250

## 2019-11-03 MED ORDER — SOD CITRATE-CITRIC ACID 500-334 MG/5ML PO SOLN: 30.0000 mL | ORAL | Status: DC | PRN

## 2019-11-03 MED ORDER — PHENYLEPHRINE 40 MCG/ML (10ML) SYRINGE FOR IV PUSH (FOR BLOOD PRESSURE SUPPORT): 80.0000 ug | PREFILLED_SYRINGE | INTRAVENOUS | Status: DC | PRN

## 2019-11-03 MED ORDER — MISOPROSTOL 200 MCG PO TABS
1000.0000 ug | ORAL_TABLET | Freq: Once | ORAL | Status: AC
  Administered 2019-11-03: 1000 ug via RECTAL

## 2019-11-03 MED ORDER — MISOPROSTOL 200 MCG PO TABS: 1000.0000 ug | ORAL_TABLET | Freq: Once | ORAL | Status: DC

## 2019-11-03 MED ORDER — MISOPROSTOL 25 MCG QUARTER TABLET
25.0000 ug | ORAL_TABLET | ORAL | Status: DC | PRN
  Administered 2019-11-03 (×2): 25 ug via VAGINAL
  Filled 2019-11-03 (×2): qty 1

## 2019-11-03 MED ORDER — LACTATED RINGERS IV BOLUS: 1000.0000 mL | Freq: Once | INTRAVENOUS | Status: DC

## 2019-11-03 MED ORDER — FENTANYL CITRATE (PF) 100 MCG/2ML IJ SOLN
50.0000 ug | INTRAMUSCULAR | Status: DC | PRN
  Administered 2019-11-03 (×2): 100 ug via INTRAVENOUS
  Filled 2019-11-03 (×2): qty 2

## 2019-11-03 MED ORDER — ONDANSETRON HCL 4 MG/2ML IJ SOLN
4.0000 mg | Freq: Four times a day (QID) | INTRAMUSCULAR | Status: DC | PRN
  Administered 2019-11-03: 4 mg via INTRAVENOUS
  Filled 2019-11-03: qty 2

## 2019-11-03 MED ORDER — ACETAMINOPHEN 325 MG PO TABS: 650.0000 mg | ORAL_TABLET | ORAL | Status: DC | PRN

## 2019-11-03 MED ORDER — TRANEXAMIC ACID-NACL 1000-0.7 MG/100ML-% IV SOLN
INTRAVENOUS | Status: AC
  Filled 2019-11-03: qty 100

## 2019-11-03 NOTE — Anesthesia Preprocedure Evaluation (Signed)
Anesthesia Evaluation  Patient identified by MRN, date of birth, ID band Patient awake    Reviewed: Allergy & Precautions, NPO status , Patient's Chart, lab work & pertinent test results  Airway Mallampati: II  TM Distance: >3 FB Neck ROM: Full    Dental no notable dental hx. (+) Teeth Intact   Pulmonary neg pulmonary ROS,    Pulmonary exam normal breath sounds clear to auscultation       Cardiovascular Exercise Tolerance: Good negative cardio ROS Normal cardiovascular exam Rhythm:Regular Rate:Normal     Neuro/Psych negative neurological ROS  negative psych ROS   GI/Hepatic negative GI ROS, Neg liver ROS,   Endo/Other  negative endocrine ROS  Renal/GU negative Renal ROS     Musculoskeletal negative musculoskeletal ROS (+)   Abdominal   Peds  Hematology hgb 10.7 Plts 146   Anesthesia Other Findings   Reproductive/Obstetrics (+) Pregnancy                             Anesthesia Physical Anesthesia Plan  ASA: II  Anesthesia Plan: Epidural   Post-op Pain Management:    Induction:   PONV Risk Score and Plan:   Airway Management Planned:   Additional Equipment:   Intra-op Plan:   Post-operative Plan:   Informed Consent: I have reviewed the patients History and Physical, chart, labs and discussed the procedure including the risks, benefits and alternatives for the proposed anesthesia with the patient or authorized representative who has indicated his/her understanding and acceptance.       Plan Discussed with:   Anesthesia Plan Comments: (41wk G2P0 w gestational thrombocytopenia for LEA)        Anesthesia Quick Evaluation

## 2019-11-03 NOTE — Progress Notes (Signed)
Melanie Valencia is a 24 y.o. G2P0010 at [redacted]w[redacted]d by admitted for induction of labor for post term pregnancy  Subjective: Patient comfortable with epidural.   Objective: BP 118/66   Pulse (!) 102   Temp 99.5 F (37.5 C) (Oral)   Resp 16   Ht 5\' 4"  (1.626 m)   Wt 84.6 kg   LMP 01/20/2019 Comment: First period after SAB. Not Normal.   BMI 32.01 kg/m  No intake/output data recorded. No intake/output data recorded.  FHT:  FHR: 125 bpm, variability: moderate,  accelerations:  Present,  decelerations:  Absent UC:   irregular, every 2 to 4 minutes SVE:   Dilation: 3 Effacement (%): 90 Station: -3 Exam by:: Dr. [email or web address]  Labs: Lab Results  Component Value Date   WBC 5.9 11/03/2019   HGB 10.7 (L) 11/03/2019   HCT 34.9 (L) 11/03/2019   MCV 82.3 11/03/2019   PLT 146 (L) 11/03/2019    Assessment / Plan: Induction of labor for post term pregnancy   Labor: Progressing normally, s/p cytotec x 2 doses.  Start pitocin titration.  Preeclampsia:  None Fetal Wellbeing:  Category I Pain Control:  Epidural I/D:  GBS positive, start penicillin.  Anticipated MOD:  NSVD  01/01/2020, MD.  11/03/2019, 1:49 PM

## 2019-11-03 NOTE — Progress Notes (Signed)
Melanie Valencia is a 24 y.o. G2P0010 at [redacted]w[redacted]d admitted for induction of labor for post term pregnancy  Subjective: Patient comfortable with epidural, feels some vaginal pressure with contractions.   Objective: BP (!) 110/54   Pulse (!) 108   Temp 99 F (37.2 C) (Oral)   Resp 16   Ht 5\' 4"  (1.626 m)   Wt 84.6 kg   LMP 01/20/2019 Comment: First period after SAB. Not Normal.   BMI 32.01 kg/m  No intake/output data recorded. No intake/output data recorded.  FHT:  FHR: 120 bpm, variability: moderate,  accelerations:  Present,  decelerations:  Absent UC:   regular, every 2 to3 minutes SVE:   Dilation: 4.5 Effacement (%): 90 Station: -2 Exam by:: Dr. [email or web address] AROM done at about 1700, blood tinged amniotic fluid.   Labs: Lab Results  Component Value Date   WBC 5.9 11/03/2019   HGB 10.7 (L) 11/03/2019   HCT 34.9 (L) 11/03/2019   MCV 82.3 11/03/2019   PLT 146 (L) 11/03/2019    Assessment / Plan: Induction of labor for post term pregnancy Labor: Progressing normally, s/p cytotec x 2, on pitocin and s/p AROM Preeclampsia:  None Fetal Wellbeing:  Category I Pain Control:  Epidural I/D:  GBS positive on penicillin Anticipated MOD:  NSVD.   01/01/2020, MD.  11/03/2019, 5:27 PM

## 2019-11-03 NOTE — Anesthesia Procedure Notes (Signed)
Epidural Patient location during procedure: OB Start time: 11/03/2019 12:49 PM  Staffing Anesthesiologist: Trevor Iha, MD Performed: anesthesiologist   Preanesthetic Checklist Completed: patient identified, IV checked, site marked, risks and benefits discussed, surgical consent, monitors and equipment checked, pre-op evaluation and timeout performed  Epidural Patient position: sitting Prep: DuraPrep and site prepped and draped Patient monitoring: continuous pulse ox and blood pressure Approach: midline Location: L3-L4 Injection technique: LOR air  Needle:  Needle type: Tuohy  Needle gauge: 17 G Needle length: 9 cm and 9 Needle insertion depth: 5 cm cm Catheter type: closed end flexible Catheter size: 19 Gauge Catheter at skin depth: 10 cm Test dose: negative  Assessment Events: blood not aspirated, injection not painful, no injection resistance, no paresthesia and negative IV test  Additional Notes Patient identified. Risks/Benefits/Options discussed with patient including but not limited to bleeding, infection, nerve damage, paralysis, failed block, incomplete pain control, headache, blood pressure changes, nausea, vomiting, reactions to medication both or allergic, itching and postpartum back pain. Confirmed with bedside nurse the patient's most recent platelet count. Confirmed with patient that they are not currently taking any anticoagulation, have any bleeding history or any family history of bleeding disorders. Patient expressed understanding and wished to proceed. All questions were answered. Sterile technique was used throughout the entire procedure. Please see nursing notes for vital signs. Test dose was given through epidural needle and negative prior to continuing to dose epidural or start infusion. Warning signs of high block given to the patient including shortness of breath, tingling/numbness in hands, complete motor block, or any concerning symptoms with  instructions to call for help. Patient was given instructions on fall risk and not to get out of bed. All questions and concerns addressed with instructions to call with any issues.  Attempt (S) . Patient tolerated procedure well.

## 2019-11-04 ENCOUNTER — Encounter (HOSPITAL_COMMUNITY): Payer: Self-pay | Admitting: Obstetrics & Gynecology

## 2019-11-04 LAB — KLEIHAUER-BETKE STAIN
# Vials RhIg: 1
Fetal Cells %: 0 %
Quantitation Fetal Hemoglobin: 0 mL

## 2019-11-04 LAB — CBC
HCT: 26.9 % — ABNORMAL LOW (ref 36.0–46.0)
Hemoglobin: 8.4 g/dL — ABNORMAL LOW (ref 12.0–15.0)
MCH: 25.3 pg — ABNORMAL LOW (ref 26.0–34.0)
MCHC: 31.2 g/dL (ref 30.0–36.0)
MCV: 81 fL (ref 80.0–100.0)
Platelets: 142 10*3/uL — ABNORMAL LOW (ref 150–400)
RBC: 3.32 MIL/uL — ABNORMAL LOW (ref 3.87–5.11)
RDW: 17.5 % — ABNORMAL HIGH (ref 11.5–15.5)
WBC: 11 10*3/uL — ABNORMAL HIGH (ref 4.0–10.5)
nRBC: 0 % (ref 0.0–0.2)

## 2019-11-04 MED ORDER — DIBUCAINE (PERIANAL) 1 % EX OINT: 1.0000 "application " | TOPICAL_OINTMENT | CUTANEOUS | Status: DC | PRN

## 2019-11-04 MED ORDER — TETANUS-DIPHTH-ACELL PERTUSSIS 5-2.5-18.5 LF-MCG/0.5 IM SUSP: 0.5000 mL | Freq: Once | INTRAMUSCULAR | Status: DC

## 2019-11-04 MED ORDER — DIPHENHYDRAMINE HCL 25 MG PO CAPS: 25.0000 mg | ORAL_CAPSULE | Freq: Four times a day (QID) | ORAL | Status: DC | PRN

## 2019-11-04 MED ORDER — WITCH HAZEL-GLYCERIN EX PADS
1.0000 "application " | MEDICATED_PAD | CUTANEOUS | Status: DC | PRN
  Administered 2019-11-04 – 2019-11-05 (×2): 1 via TOPICAL

## 2019-11-04 MED ORDER — PRENATAL 27-0.8 MG PO TABS: 1.0000 | ORAL_TABLET | Freq: Every day | ORAL | Status: DC

## 2019-11-04 MED ORDER — MEDROXYPROGESTERONE ACETATE 150 MG/ML IM SUSP: 150.0000 mg | INTRAMUSCULAR | Status: DC | PRN

## 2019-11-04 MED ORDER — COCONUT OIL OIL: 1.0000 "application " | TOPICAL_OIL | Status: DC | PRN

## 2019-11-04 MED ORDER — ONDANSETRON HCL 4 MG PO TABS: 4.0000 mg | ORAL_TABLET | ORAL | Status: DC | PRN

## 2019-11-04 MED ORDER — ZOLPIDEM TARTRATE 5 MG PO TABS: 5.0000 mg | ORAL_TABLET | Freq: Every evening | ORAL | Status: DC | PRN

## 2019-11-04 MED ORDER — SENNOSIDES-DOCUSATE SODIUM 8.6-50 MG PO TABS
2.0000 | ORAL_TABLET | ORAL | Status: DC
  Administered 2019-11-04 (×2): 2 via ORAL
  Filled 2019-11-04 (×2): qty 2

## 2019-11-04 MED ORDER — ONDANSETRON HCL 4 MG/2ML IJ SOLN: 4.0000 mg | INTRAMUSCULAR | Status: DC | PRN

## 2019-11-04 MED ORDER — BENZOCAINE-MENTHOL 20-0.5 % EX AERO
1.0000 "application " | INHALATION_SPRAY | CUTANEOUS | Status: DC | PRN
  Administered 2019-11-04 – 2019-11-05 (×2): 1 via TOPICAL
  Filled 2019-11-04 (×2): qty 56

## 2019-11-04 MED ORDER — ACETAMINOPHEN 325 MG PO TABS
650.0000 mg | ORAL_TABLET | ORAL | Status: DC | PRN
  Administered 2019-11-04 (×2): 650 mg via ORAL
  Filled 2019-11-04 (×2): qty 2

## 2019-11-04 MED ORDER — IBUPROFEN 600 MG PO TABS
600.0000 mg | ORAL_TABLET | Freq: Four times a day (QID) | ORAL | Status: DC
  Administered 2019-11-04 – 2019-11-05 (×6): 600 mg via ORAL
  Filled 2019-11-04 (×6): qty 1

## 2019-11-04 MED ORDER — PRENATAL MULTIVITAMIN CH
1.0000 | ORAL_TABLET | Freq: Every day | ORAL | Status: DC
  Administered 2019-11-04 – 2019-11-05 (×2): 1 via ORAL
  Filled 2019-11-04 (×2): qty 1

## 2019-11-04 MED ORDER — OXYCODONE HCL 5 MG PO TABS: 5.0000 mg | ORAL_TABLET | Freq: Four times a day (QID) | ORAL | Status: DC | PRN

## 2019-11-04 MED ORDER — SIMETHICONE 80 MG PO CHEW: 80.0000 mg | CHEWABLE_TABLET | ORAL | Status: DC | PRN

## 2019-11-04 NOTE — Progress Notes (Signed)
Subjective: Postpartum Day # 1 : S/P NSVD due to IOL for postdates. Patient up ad lib, denies syncope or dizziness. Reports consuming regular diet without issues and denies N/V. Patient reports 0 bowel movement + passing flatus.  Denies issues with urination and reports bleeding is "lighter."  Patient is breast and bottle feeding and reports going well.  Desires undecided for postpartum contraception.  Pain is being appropriately managed with use of po meds. Pt had PPH with QBl of 1100 given cytotec and hgb stable now at 9.6, pt refused iron due to it making it her nausea, pt asymptomatic.    2nd laceration Feeding:  Breast and bottle Contraceptive plan:  undiced BB: Circ out pt desired  Objective: Vital signs in last 24 hours: Patient Vitals for the past 24 hrs:  BP Temp Temp src Pulse Resp SpO2  11/04/19 0500 (!) 108/59 98.9 F (37.2 C) Axillary 96 18 100 %  11/04/19 0300 -- 99.6 F (37.6 C) -- -- -- --  11/04/19 0103 115/69 99.9 F (37.7 C) Oral -- 16 100 %  11/04/19 0000 121/67 99.4 F (37.4 C) Oral (!) 116 16 100 %  11/03/19 2330 139/71 -- -- (!) 143 16 --  11/03/19 2316 (!) 121/54 -- -- (!) 121 -- --  11/03/19 2301 (!) 151/65 -- -- (!) 127 16 100 %  11/03/19 2247 134/64 -- -- (!) 147 18 --  11/03/19 2229 (!) 146/94 99.1 F (37.3 C) Oral (!) 154 18 --  11/03/19 2202 (!) 113/49 -- -- (!) 120 17 --  11/03/19 2147 (!) 128/54 -- -- (!) 140 16 --  11/03/19 2131 (!) 150/111 -- -- (!) 160 -- --  11/03/19 2101 129/72 -- -- (!) 102 -- --  11/03/19 2031 119/73 -- -- (!) 115 16 --  11/03/19 2001 (!) 115/56 -- -- 97 18 100 %  11/03/19 1931 122/60 98.7 F (37.1 C) Oral (!) 109 16 98 %  11/03/19 1901 (!) 129/59 -- -- (!) 127 -- --  11/03/19 1900 -- 98.4 F (36.9 C) Oral -- 16 --  11/03/19 1830 118/63 -- -- 99 14 --  11/03/19 1800 (!) 111/52 -- -- (!) 103 16 --  11/03/19 1730 (!) 114/54 -- -- (!) 104 14 --  11/03/19 1700 (!) 110/54 99 F (37.2 C) Oral (!) 108 14 --  11/03/19 1630 (!)  109/48 -- -- (!) 102 16 --  11/03/19 1600 (!) 117/51 -- -- 98 14 --  11/03/19 1530 (!) 116/52 -- -- 94 16 --  11/03/19 1500 122/62 -- -- 95 14 --  11/03/19 1430 119/64 -- -- (!) 104 14 --  11/03/19 1400 108/62 -- -- (!) 106 16 --  11/03/19 1335 118/66 -- -- (!) 102 16 --  11/03/19 1330 118/66 -- -- 97 14 --  11/03/19 1325 121/69 -- -- 95 16 --  11/03/19 1320 128/69 -- -- (!) 110 14 --  11/03/19 1315 121/67 -- -- 96 16 --  11/03/19 1310 123/68 -- -- 95 14 --  11/03/19 1305 127/69 -- -- (!) 101 14 --  11/03/19 1300 133/72 99.5 F (37.5 C) Oral (!) 126 16 --  11/03/19 1215 118/67 -- -- (!) 111 14 --  11/03/19 1100 (!) 122/57 -- -- (!) 109 16 --  11/03/19 1000 (!) 114/59 -- -- 96 14 --     Physical Exam:  General: alert, cooperative, appears stated age and no distress Mood/Affect: Happy Lungs: clear to auscultation, no wheezes, rales or rhonchi, symmetric  air entry.  Heart: normal rate, regular rhythm, normal S1, S2, no murmurs, rubs, clicks or gallops. Breast: breasts appear normal, no suspicious masses, no skin or nipple changes or axillary nodes. Abdomen:  + bowel sounds, soft, non-tender GU: perineum approximate, healing well. No signs of external hematomas.  Uterine Fundus: firm Lochia: appropriate Skin: Warm, Dry. DVT Evaluation: No evidence of DVT seen on physical exam. Negative Homan's sign. No cords or calf tenderness. No significant calf/ankle edema.  CBC Latest Ref Rng & Units 11/04/2019 11/03/2019 11/03/2019  WBC 4.0 - 10.5 K/uL 11.0(H) 12.5(H) 5.9  Hemoglobin 12.0 - 15.0 g/dL 5.0(K) 9.3(G) 10.7(L)  Hematocrit 36.0 - 46.0 % 26.9(L) 30.7(L) 34.9(L)  Platelets 150 - 400 K/uL 142(L) 147(L) 146(L)    Results for orders placed or performed during the hospital encounter of 11/03/19 (from the past 24 hour(s))  CBC     Status: Abnormal   Collection Time: 11/03/19 11:00 PM  Result Value Ref Range   WBC 12.5 (H) 4.0 - 10.5 K/uL   RBC 3.77 (L) 3.87 - 5.11 MIL/uL   Hemoglobin  9.6 (L) 12.0 - 15.0 g/dL   HCT 18.2 (L) 99.3 - 71.6 %   MCV 81.4 80.0 - 100.0 fL   MCH 25.5 (L) 26.0 - 34.0 pg   MCHC 31.3 30.0 - 36.0 g/dL   RDW 96.7 (H) 89.3 - 81.0 %   Platelets 147 (L) 150 - 400 K/uL   nRBC 0.0 0.0 - 0.2 %  CBC     Status: Abnormal   Collection Time: 11/04/19  5:47 AM  Result Value Ref Range   WBC 11.0 (H) 4.0 - 10.5 K/uL   RBC 3.32 (L) 3.87 - 5.11 MIL/uL   Hemoglobin 8.4 (L) 12.0 - 15.0 g/dL   HCT 17.5 (L) 10.2 - 58.5 %   MCV 81.0 80.0 - 100.0 fL   MCH 25.3 (L) 26.0 - 34.0 pg   MCHC 31.2 30.0 - 36.0 g/dL   RDW 27.7 (H) 82.4 - 23.5 %   Platelets 142 (L) 150 - 400 K/uL   nRBC 0.0 0.0 - 0.2 %     CBG (last 3)  No results for input(s): GLUCAP in the last 72 hours.   I/O last 3 completed shifts: In: 1844.5 [I.V.:1546.2; IV Piggyback:298.3] Out: 1486 [Urine:300; Blood:1186]   Assessment Postpartum Day # 1 : S/P NSVD due to IOL for postdates. Pt stable. -1 involution. breast and bottlefeeding. Hemodynamically stable, hbg drop from 10.7-9.6 after PPH of EBL 1100, refuses iron po, asymptomatic.   Plan: Continue other mgmt as ordered PP anemia: Increase leafy green diet.  VTE prophylactics: Early ambulated as tolerates.  Pain control: Motrin/Tylenol PRN Education given regarding options for contraception, including barrier methods, injectable contraception, IUD placement, oral contraceptives.  Plan for discharge tomorrow, Breastfeeding and Lactation consult   Dr. Mora Appl to be updated on patient status  [facility] NP-C, CNM 11/04/2019, 8:52 AM

## 2019-11-04 NOTE — Progress Notes (Addendum)
This RN went in pt's room to inform her that she would be receiving Rhogam. Educated pt on reason for Rhogam. Pt states "but I don't need that because I am B+." This RN rechecked pt's listed blood type from this admission's type and screen (drawn 11/03/19 @0336 ), and informed pt that she is B- (resulted on 11/03/19 @ 0449). Pt states "01/01/20 from Chip Boer told me that I am B+ and so did the office." This RN again read to pt most recent blood type as B-. Pt refused rhogam and states "I'll just wait until I talk to my doctor."  This RN called to inform [facility], FNP, CNM of pt's decision. Per [facility], she will come talk with pt soon.  This RN called to inform Lesly Rubenstein in the blood bank of happenings and to not discard Rhogam just yet.   Adendeum: 6:40 PM I reviwed the labs with pt in her room, athena states she is B+ and her admission lab type and screen here says its B-, pt will require rhogam if in fact she is B-, will redraw type and screen now if neg pt will receive rhogam, reviewed with pt what happen if rhogam is required and not given.   [place], [place] 11/04/2019

## 2019-11-04 NOTE — Lactation Note (Signed)
This note was copied from a baby's chart. Lactation Consultation Note  Patient Name: Boy Tashari Schoenfelder HUDJS'H Date: 11/04/2019 Reason for consult: Initial assessment;Term;1st time breastfeeding;Primapara  1752 - 1813 - Prior to entering the room, I spoke with RN about Ms. Mattice's feeding plan. Her flowsheets indicate bottle feeding. Her RN stated that Ms. Noll prefers to pump and bottle feed. She has a DEBP set up in the room.  When I entered the room, the lights were off, but Ms. Hartsell was awake (another provider exited upon entry and stated that it was okay to go in). Baby was in bassinet swaddled. He fed about an hour ago.  Ms. Spicer states that she initially wanted to breast feed, but when 20 hour old baby "Lamire" had difficulty latching, she opted to bottle feed because she didn't want him to be hungry. She has a DEBP set up in the room. She states that she has not been pumping because nothing has come out, and it's uncomfortable.  We discussed what to expect in terms of milk volume for days 1-3 and when to expect her mature milk to transition. I tried to reinforce that it's normal to not see any breast milk express with a DEBP in the first 2-3 days but that the stimulation would help with milk production. I did a flange fitting and resized her to a 27. We adjusted the pump pressure as well. She reports that it was more tolerable, and that she just has sensitive nipples. She did report positive breast changes in pregnancy. Towards the end of the visit, she asked to stop pumping. We discontinued.  Ms. Geddis states that she has a personal pump at home; she could not recall the brand.  I recommended that she pump for 15 minutes 8 times a day for the best outcomes with breast milk production. I volunteered to return later tonight to help her latch baby Tonye Becket if she would like to try. I encouraged her to page.  I also reviewed the basic breast feeding education from the [facility] guide. Ms. Padron and her support  person were attuned to this conversation and thanked me for the information. I provided them with supplementation guidelines. They have been giving up to 30 mls by bottle; I indicated that beginning on day 2 a normal supplementation amount is about 15-25 mls (formula only).  No further questions at this time. Breastfeeding brochure provided.  Maternal Data Has patient been taught Hand Expression?: Yes(states that her RN showed her) Does the patient have breastfeeding experience prior to this delivery?: No  Feeding Feeding Type: Bottle Fed - Formula Nipple Type: Slow - flow   Interventions Interventions: Breast feeding basics reviewed;DEBP  Lactation Tools Discussed/Used Tools: Pump;Flanges Flange Size: 27 Breast pump type: Double-Electric Breast Pump Pump Review: Setup, frequency, and cleaning;Milk Storage   Consult Status Consult Status: Follow-up Date: 11/05/19 Follow-up type: In-patient    Walker Shadow 11/04/2019, 6:46 PM

## 2019-11-04 NOTE — Anesthesia Postprocedure Evaluation (Signed)
Anesthesia Post Note  Patient: Melanie Valencia  Procedure(s) Performed: AN AD HOC LABOR EPIDURAL     Patient location during evaluation: Mother Baby Anesthesia Type: Epidural Level of consciousness: awake and alert, oriented and patient cooperative Pain management: pain level controlled Vital Signs Assessment: post-procedure vital signs reviewed and stable Respiratory status: spontaneous breathing Cardiovascular status: stable Postop Assessment: no headache, epidural receding, patient able to bend at knees, no signs of nausea or vomiting and able to ambulate Anesthetic complications: no Comments: Pt. States she is walking.  Pain score 4.  Pt. States pain is manageable.     Last Vitals:  Vitals:   11/04/19 0900 11/04/19 1300  BP: 116/60 106/60  Pulse: 88 88  Resp: 17 18  Temp: 36.9 C 36.7 C  SpO2: 100% 100%    Last Pain:  Vitals:   11/04/19 1300  TempSrc: Axillary  PainSc:    Pain Goal: Patients Stated Pain Goal: 4 (11/03/19 1700)                 Merrilyn Puma

## 2019-11-05 MED ORDER — IBUPROFEN 600 MG PO TABS: 600.0000 mg | ORAL_TABLET | Freq: Four times a day (QID) | ORAL | 0 refills | Status: AC

## 2019-11-05 NOTE — Discharge Summary (Signed)
SVD OB Discharge Summary     Patient Name: Melanie Valencia DOB: Dec 05, 1995 MRN: [id number]  Date of admission: 11/03/2019 Delivering MD: Beatrix Fetters B  Date of delivery: 11/03/2019 Type of delivery: SVD  Newborn Data: Sex: Baby female Out pt circumcision desired.  Live born female  Birth Weight: 8 lb 10.1 oz (3915 g) APGAR: 8, 9  Newborn Delivery   Birth date/time: 11/03/2019 21:42:00 Delivery type: Vaginal, Spontaneous      Feeding: breast and bottle Infant being discharge to home with mother in stable condition.   Admitting diagnosis: Post-dates pregnancy [O48.0] Intrauterine pregnancy: [redacted]w[redacted]d     Secondary diagnosis:  Active Problems:   GBS (group B Streptococcus carrier), +RV culture, currently pregnant   Anemia of pregnancy   Alpha thalassemia trait   Encounter for planned induction of labor   Post-dates pregnancy   RhD negative   PPH (postpartum hemorrhage)                                Complications: [id number]                                                              Intrapartum Procedures: spontaneous vaginal delivery and GBS prophylaxis Postpartum Procedures: none and Pt had weak D antibiotdy, I recommended Rhogam for the pt, pt declined, I educated the pt about risk of having another baby and the week D tramsittion as fetal demise, hydrops, ect, pt declined to recieve the rhogam.  Complications-Operative and Postpartum: 2nd degree perineal laceration Augmentation: AROM and Pitocin   History of Present Illness: Ms. Melanie Valencia is a 24 y.o. female, G2P1011, who presents at [redacted]w[redacted]d weeks gestation. The patient has been followed at  [facility] and Gynecology  Her pregnancy has been complicated by:  Patient Active Problem List   Diagnosis Date Noted  . Post-dates pregnancy 11/03/2019  . PPH (postpartum hemorrhage) 11/03/2019  . GBS (group B Streptococcus carrier), +RV culture, currently pregnant 11/02/2019  . Anemia of pregnancy  11/02/2019  . Alpha thalassemia trait 11/02/2019  . Encounter for planned induction of labor 11/02/2019  . RhD negative 12/24/2018    Hospital course:  Induction of Labor With Vaginal Delivery   23 y.o. yo G2P1011 at [redacted]w[redacted]d was admitted to the hospital 11/03/2019 for induction of labor.  Indication for induction: Postdates.  Patient had an uncomplicated labor course as follows: Membrane Rupture Time/Date: 4:50 PM ,11/03/2019   Intrapartum Procedures: Episiotomy: None [1]                                         Lacerations:  2nd degree [3];Periurethral [8]  Patient had delivery of a Viable infant.  Information for the patient's newborn:  Shalayna, Ornstein [[id number]]  Delivery Method: Vaginal, Spontaneous(Filed from Delivery Summary)    11/03/2019  Details of delivery can be found in separate delivery note.  Patient had a routine postpartum course. Patient is discharged home 11/05/19. Postpartum Day # 2 : S/P NSVD due to IOL for posdates. Patient up ad lib, denies syncope or dizziness. Reports consuming regular diet without issues and denies N/V. Patient  reports 0 bowel movement + passing flatus.  Denies issues with urination and reports bleeding is "lighter."  Patient is breast and bottle feeding and reports going well.  Desires undecided for postpartum contraception.  Pain is being appropriately managed with use of po meds. Had PPH with EBL 1100, hgb dropped to 9.6 PP, pt declined iron.   Physical exam  Vitals:   11/04/19 0900 11/04/19 1300 11/04/19 1702 11/05/19 0500  BP: 116/60 106/60 119/71 (!) 115/54  Pulse: 88 88 98 84  Resp: 17 18 18 16   Temp: 98.4 F (36.9 C) 98.1 F (36.7 C) 98.3 F (36.8 C) 98.4 F (36.9 C)  TempSrc: Axillary Axillary Oral Oral  SpO2: 100% 100%  100%  Weight:      Height:       General: alert, cooperative and no distress Lochia: appropriate Uterine Fundus: firm Perineum: approximate, no hematomas  DVT Evaluation: No evidence of DVT seen on physical  exam. Negative Homan's sign. No cords or calf tenderness. No significant calf/ankle edema.  Labs: Lab Results  Component Value Date   WBC 11.0 (H) 11/04/2019   HGB 8.4 (L) 11/04/2019   HCT 26.9 (L) 11/04/2019   MCV 81.0 11/04/2019   PLT 142 (L) 11/04/2019   CMP Latest Ref Rng & Units 07/19/2019  Glucose 70 - 99 mg/dL 78  BUN 6 - 20 mg/dL 5(L)  Creatinine 07/21/2019 - 1.00 mg/dL 7.09  Sodium 6.28 - 366 mmol/L 136  Potassium 3.5 - 5.1 mmol/L 3.6  Chloride 98 - 111 mmol/L 105  CO2 22 - 32 mmol/L 22  Calcium 8.9 - 10.3 mg/dL 9.0  Total Protein 6.5 - 8.1 g/dL -  Total Bilirubin 0.3 - 1.2 mg/dL -  Alkaline Phos 38 - 294 U/L -  AST 15 - 41 U/L -  ALT 0 - 44 U/L -    Date of discharge: 11/05/2019 Discharge Diagnoses: Post-date pregnancy delivered, [facility]  Discharge instruction: per After Visit Summary and "Baby and Me Booklet".  After visit meds:   Activity:           unrestricted and pelvic rest Advance as tolerated. Pelvic rest for 6 weeks.  Diet:                routine Medications: PNV and Ibuprofen Postpartum contraception: Undecided Condition:  Pt discharge to home with baby in stable Anemia: Eat leafy greens   Meds: Allergies as of 11/05/2019      Reactions   Latex    Itching with condoms      Medication List    TAKE these medications   ibuprofen 600 MG tablet Commonly known as: ADVIL Take 1 tablet (600 mg total) by mouth every 6 (six) hours.   multivitamin-prenatal 27-0.8 MG Tabs tablet Take 1 tablet by mouth daily at 12 noon.       Discharge Follow Up:  Follow-up Information    [facility]. Schedule an appointment as soon as possible for a visit in 6 week(s).   Specialty: Obstetrics and Gynecology Why: Please also make a 1 week circ for your baby female.  Contact information: [address] [place] [phone]           [place], NP-C, CNM 11/05/2019, 10:49 AM  01/03/2020,  FNP

## 2019-11-05 NOTE — Progress Notes (Signed)
CSW received and acknowledges consult for EDPS of 9.  Consult screened out due to 9 on EDPS does not warrant a CSW consult.  MOB whom scores are greater than 9/yes to question 10 on [place] Postpartum Depression Screen warrants a CSW consult.   Tykira Wachs, LCSW Clinical Social Worker [facility] Cell#: [phone]  

## 2019-11-05 NOTE — Progress Notes (Signed)
[facility] FNP went to talk to patient at shift change regarding blood type and rhogam. From what I was told in report, pt has a variance in blood type and depending on what reagent they used to test the blood will determine if pt blood type comes back positive or negative. After Jade left the room I asked pt what she decided on after speaking with Lesly Rubenstein and she declined rhogam despite what the FNP discussed with pt. Pt states she will get rhogam shot during her next pregnancy but refuses it at this time.

## 2019-11-05 NOTE — Lactation Note (Signed)
This note was copied from a baby's chart. Lactation Consultation Note  Patient Name: Melanie Valencia VZDGL'O Date: 11/05/2019 Reason for consult: Follow-up assessment;Term;Primapara  P1 mother whose infant is now 50 hours old.  This is a term baby.  Mother initially was planning to breast feed.  When her baby was 30 hours old she decided to pump and bottle feed only.  She has been providing formula only at this time.  When questioned about her pumping and if she has seen any EBM yet, mother stated she has not been pumping.  When asked about her goals mother stated she wants "to do both" meaning pump and bottle feed as well as formula feed.  Reiterated the importance of pumping on a consistent schedule, every three hours, to help stimulate the breasts and increase milk supply.  Previous LC worked with her yesterday; I may have to question whether or not she will continue this plan.  Mother has a DEBP at home if she chooses to pump and bottle feed.  Mother had no questions regarding pumping.  I acknowledged her wishes and informed her that I was here to guide her with whatever decision is appropriate for her.  I did not want her to feel pressured to pump consistently if her desire is not sincere.    Asked her to call for my assistance as needed if she has any questions/concerns.  Father present.   Maternal Data    Feeding Feeding Type: Formula Nipple Type: Slow - flow  LATCH Score                   Interventions    Lactation Tools Discussed/Used     Consult Status Consult Status: Complete Date: 11/05/19 Follow-up type: Call as needed    Lanise Mergen R Nicholis Stepanek 11/05/2019, 12:19 PM

## 2019-11-07 LAB — RH IG WORKUP (INCLUDES ABO/RH)
ABO/RH(D): B NEG
Gestational Age(Wks): 41
Unit division: 0

## 2020-06-18 LAB — RPR: RPR Ser Ql: NONREACTIVE — AB

## 2020-07-07 ENCOUNTER — Ambulatory Visit (INDEPENDENT_AMBULATORY_CARE_PROVIDER_SITE_OTHER): Payer: 59

## 2020-07-07 ENCOUNTER — Ambulatory Visit (HOSPITAL_COMMUNITY)
Admission: EM | Admit: 2020-07-07 | Discharge: 2020-07-07 | Disposition: A | Discharge status: Home / Self Care | Payer: 59 | Attending: Emergency Medicine | Admitting: Emergency Medicine

## 2020-07-07 ENCOUNTER — Encounter (HOSPITAL_COMMUNITY): Payer: Self-pay

## 2020-07-07 ENCOUNTER — Other Ambulatory Visit: Payer: Self-pay

## 2020-07-07 DIAGNOSIS — Z20822 Contact with and (suspected) exposure to covid-19: Secondary | ICD-10-CM | POA: Insufficient documentation

## 2020-07-07 DIAGNOSIS — R05 Cough: Secondary | ICD-10-CM

## 2020-07-07 DIAGNOSIS — R5383 Other fatigue: Secondary | ICD-10-CM | POA: Diagnosis not present

## 2020-07-07 DIAGNOSIS — R6883 Chills (without fever): Secondary | ICD-10-CM | POA: Diagnosis not present

## 2020-07-07 DIAGNOSIS — J069 Acute upper respiratory infection, unspecified: Secondary | ICD-10-CM | POA: Insufficient documentation

## 2020-07-07 MED ORDER — ALBUTEROL SULFATE HFA 108 (90 BASE) MCG/ACT IN AERS: 1.0000 | INHALATION_SPRAY | Freq: Four times a day (QID) | RESPIRATORY_TRACT | 0 refills | Status: AC | PRN

## 2020-07-07 MED ORDER — PREDNISONE 10 MG PO TABS: ORAL_TABLET | ORAL | 0 refills | Status: AC

## 2020-07-07 NOTE — ED Triage Notes (Addendum)
Pt c/o chills, fatigue, body aches, productive cough w/yellow/clear mucous and SOB x2 wks. Pt states her baby tested COVID + 2 wks ago. Pt has non labored breathing. Skin color WNL.

## 2020-07-07 NOTE — ED Provider Notes (Signed)
[facility]    CSN: [id number] Arrival date & time: 07/07/20  1414      History   Chief Complaint Chief Complaint  Patient presents with  . Fatigue    HPI Melanie Valencia is a 24 y.o. female.   Abut 2 weeks of productive cough, congestion, SOB intermittently, fevers, nausea. Taking tylenol for fevers without much relief. Fever did finally break the past day or so, and denies vomiting, diarrhea, CP, wheezing, headaches. Her baby tested positive for COVID 2 weeks ago, she did not get tested because she assumed she would be as well. Does not smoke, has never been diagnosed with asthma though has had breathing issues before requring an inhaler.      Past Medical History:  Diagnosis Date  . Anemia   . Anxiety    denies  . Infection    UTI  . Thalassemia trait, alpha     Patient Active Problem List   Diagnosis Date Noted  . Post-dates pregnancy 11/03/2019  . PPH (postpartum hemorrhage) 11/03/2019  . GBS (group B Streptococcus carrier), +RV culture, currently pregnant 11/02/2019  . Anemia of pregnancy 11/02/2019  . Alpha thalassemia trait 11/02/2019  . Encounter for planned induction of labor 11/02/2019  . RhD negative 12/24/2018    Past Surgical History:  Procedure Laterality Date  . NO PAST SURGERIES      OB History    Gravida  2   Para  1   Term  1   Preterm      AB  1   Living  1     SAB  1   TAB      Ectopic      Multiple  0   Live Births  1            Home Medications    Prior to Admission medications   Medication Sig Start Date End Date Taking? Authorizing Provider  albuterol (VENTOLIN HFA) 108 (90 Base) MCG/ACT inhaler Inhale 1-2 puffs into the lungs every 6 (six) hours as needed for wheezing or shortness of breath. 07/07/20   Particia Nearing, PA-C  ibuprofen (ADVIL) 600 MG tablet Take 1 tablet (600 mg total) by mouth every 6 (six) hours. 11/05/19   Dale Upton, FNP  predniSONE (DELTASONE) 10 MG tablet Take 6 tabs day  one, 5 tabs day two, 4 tabs day three, etc 07/07/20   Particia Nearing, PA-C  Prenatal Vit-Fe Fumarate-FA (MULTIVITAMIN-PRENATAL) 27-0.8 MG TABS tablet Take 1 tablet by mouth daily at 12 noon.    [provider]    Family History Family History  Problem Relation Age of Onset  . Cancer Mother        liver  . Asthma Father     Social History Social History   Tobacco Use  . Smoking status: Never Smoker  . Smokeless tobacco: Never Used  Vaping Use  . Vaping Use: Never used  Substance Use Topics  . Alcohol use: Not Currently  . Drug use: No     Allergies   Latex   Review of Systems Review of Systems PER HPI   Physical Exam Triage Vital Signs ED Triage Vitals [07/07/20 1600]  Enc Vitals Group     BP 129/71     Pulse Rate (!) 107     Resp 18     Temp 98.3 F (36.8 C)     Temp Source Oral     SpO2 99 %  Weight 150 lb (68 kg)     Height 5\' 3"  (1.6 m)     Head Circumference      Peak Flow      Pain Score 0     Pain Loc      Pain Edu?      Excl. in GC?    No data found.  Updated Vital Signs BP 129/71   Pulse (!) 107   Temp 98.3 F (36.8 C) (Oral)   Resp 18   Ht 5\' 3"  (1.6 m)   Wt 150 lb (68 kg)   SpO2 99%   BMI 26.57 kg/m   Visual Acuity Right Eye Distance:   Left Eye Distance:   Bilateral Distance:    Right Eye Near:   Left Eye Near:    Bilateral Near:     Physical Exam Vitals and nursing note reviewed.  Constitutional:      Appearance: Normal appearance. She is not ill-appearing.  HENT:     Head: Atraumatic.     Right Ear: Tympanic membrane normal.     Left Ear: Tympanic membrane normal.     Nose: Congestion present.     Mouth/Throat:     Mouth: Mucous membranes are moist.     Pharynx: Posterior oropharyngeal erythema present.  Eyes:     Extraocular Movements: Extraocular movements intact.     Conjunctiva/sclera: Conjunctivae normal.  Cardiovascular:     Rate and Rhythm: Normal rate and regular rhythm.     Heart  sounds: Normal heart sounds.  Pulmonary:     Effort: Pulmonary effort is normal.     Breath sounds: Normal breath sounds. No wheezing or rales.  Abdominal:     General: Bowel sounds are normal. There is no distension.     Palpations: Abdomen is soft.     Tenderness: There is no abdominal tenderness. There is no guarding.  Musculoskeletal:        General: Normal range of motion.     Cervical back: Normal range of motion and neck supple.  Skin:    General: Skin is warm and dry.  Neurological:     Mental Status: She is alert and oriented to person, place, and time.  Psychiatric:        Mood and Affect: Mood normal.        Thought Content: Thought content normal.        Judgment: Judgment normal.      UC Treatments / Results  Labs (all labs ordered are listed, but only abnormal results are displayed) Labs Reviewed  SARS CORONAVIRUS 2 (TAT 6-24 HRS)    EKG   Radiology DG Chest 2 View  Result Date: 07/07/2020 CLINICAL DATA:  Cough with chills and fatigue.  Shortness of breath. EXAM: CHEST - 2 VIEW COMPARISON:  July 15, 2017 FINDINGS: Lungs are clear. Heart size and pulmonary vascularity are normal. No adenopathy. There is mild midthoracic dextroscoliosis. IMPRESSION: Lungs clear.  Heart size normal.  No adenopathy. Electronically Signed   By: 09/06/2020 III M.D.   On: 07/07/2020 17:30    Procedures Procedures (including critical care time)  Medications Ordered in UC Medications - No data to display  Initial Impression / Assessment and Plan / UC Course  I have reviewed the triage vital signs and the nursing notes.  Pertinent labs & imaging results that were available during my care of the patient were reviewed by me and considered in my medical decision making (see chart for details).  Well appearing, O2 saturation 99%, afebrile and lungs CTAB on exam. CXR today per patient request for reassurance without acute abnormality. Will tx with prednisone,  albuterol inhaler and discussed mucinex BID until feeling better. Supportive care and return precautions reviewed. COVID test pending, and discussed given she is already out of 10 day quarantine window that she should quarantine until at least 48 hours symptom free.   Final Clinical Impressions(s) / UC Diagnoses   Final diagnoses:  Viral URI with cough  Exposure to COVID-19 virus   Discharge Instructions   None    ED Prescriptions    Medication Sig Dispense Auth. Provider   predniSONE (DELTASONE) 10 MG tablet Take 6 tabs day one, 5 tabs day two, 4 tabs day three, etc 21 tablet Particia Nearing, PA-C   albuterol (VENTOLIN HFA) 108 (90 Base) MCG/ACT inhaler Inhale 1-2 puffs into the lungs every 6 (six) hours as needed for wheezing or shortness of breath. 18 g Particia Nearing, [place]     PDMP not reviewed this encounter.   Particia Nearing, [place] 07/07/20 2001

## 2020-07-08 LAB — SARS CORONAVIRUS 2 (TAT 6-24 HRS): SARS Coronavirus 2: POSITIVE — AB

## 2020-07-09 ENCOUNTER — Telehealth: Payer: Self-pay | Admitting: Physician Assistant

## 2020-07-09 NOTE — Telephone Encounter (Signed)
Called to Discuss with patient about Covid symptoms and the use of the monoclonal antibody infusion for those with mild to moderate Covid symptoms and at a high risk of hospitalization.     Pt appears to qualify for this infusion due to co-morbid conditions and/or a member of an at-risk group in accordance with the FDA Emergency Use Authorization.    Unable to reach pt, left voicemail. I also included information in a MyChart message.   Roe Rutherford Korey Prashad, PA-C 07/09/2020, 11:13 AM

## 2020-08-04 ENCOUNTER — Other Ambulatory Visit: Payer: Self-pay | Admitting: Family Medicine

## 2021-03-13 ENCOUNTER — Emergency Department (HOSPITAL_BASED_OUTPATIENT_CLINIC_OR_DEPARTMENT_OTHER)
Admission: EM | Admit: 2021-03-13 | Discharge: 2021-03-13 | Disposition: A | Discharge status: Home / Self Care | Payer: Medicaid Other | Attending: Emergency Medicine | Admitting: Emergency Medicine

## 2021-03-13 ENCOUNTER — Emergency Department (HOSPITAL_BASED_OUTPATIENT_CLINIC_OR_DEPARTMENT_OTHER): Payer: Medicaid Other

## 2021-03-13 ENCOUNTER — Other Ambulatory Visit: Payer: Self-pay

## 2021-03-13 ENCOUNTER — Encounter (HOSPITAL_BASED_OUTPATIENT_CLINIC_OR_DEPARTMENT_OTHER): Payer: Self-pay

## 2021-03-13 DIAGNOSIS — Y92524 Gas station as the place of occurrence of the external cause: Secondary | ICD-10-CM | POA: Insufficient documentation

## 2021-03-13 DIAGNOSIS — S93401A Sprain of unspecified ligament of right ankle, initial encounter: Secondary | ICD-10-CM | POA: Diagnosis not present

## 2021-03-13 DIAGNOSIS — W010XXA Fall on same level from slipping, tripping and stumbling without subsequent striking against object, initial encounter: Secondary | ICD-10-CM | POA: Diagnosis not present

## 2021-03-13 DIAGNOSIS — Z91040 Latex allergy status: Secondary | ICD-10-CM | POA: Diagnosis not present

## 2021-03-13 DIAGNOSIS — S99911A Unspecified injury of right ankle, initial encounter: Secondary | ICD-10-CM | POA: Diagnosis present

## 2021-03-13 NOTE — ED Provider Notes (Signed)
[facility] EMERGENCY DEPT Provider Note   CSN: [id number] Arrival date & time: 03/13/21  2110     History Chief Complaint  Patient presents with  . Ankle Pain    Right    Melanie Valencia is a 25 y.o. female.  Patient injured right ankle while at gas station prior to arrival.  Tripped over something.  Has not been able to ambulate since.  The history is provided by the patient.  Ankle Pain Location:  Ankle Ankle location:  R ankle Pain details:    Quality:  Aching   Radiates to:  Does not radiate   Severity:  Mild   Onset quality:  Sudden   Timing:  Constant   Progression:  Unchanged Chronicity:  New Relieved by:  Nothing Worsened by:  Bearing weight Associated symptoms: swelling   Associated symptoms: no back pain, no decreased ROM, no fatigue, no fever, no itching, no muscle weakness, no neck pain, no numbness and no stiffness        Past Medical History:  Diagnosis Date  . Anemia   . Anxiety    denies  . Infection    UTI  . Thalassemia trait, alpha     Patient Active Problem List   Diagnosis Date Noted  . Post-dates pregnancy 11/03/2019  . PPH (postpartum hemorrhage) 11/03/2019  . GBS (group B Streptococcus carrier), +RV culture, currently pregnant 11/02/2019  . Anemia of pregnancy 11/02/2019  . Alpha thalassemia trait 11/02/2019  . Encounter for planned induction of labor 11/02/2019  . RhD negative 12/24/2018    Past Surgical History:  Procedure Laterality Date  . NO PAST SURGERIES       OB History    Gravida  2   Para  1   Term  1   Preterm      AB  1   Living  1     SAB  1   IAB      Ectopic      Multiple  0   Live Births  1           Family History  Problem Relation Age of Onset  . Cancer Mother        liver  . Asthma Father     Social History   Tobacco Use  . Smoking status: Never Smoker  . Smokeless tobacco: Never Used  Vaping Use  . Vaping Use: Never used  Substance Use Topics  . Alcohol  use: Not Currently  . Drug use: No    Home Medications Prior to Admission medications   Medication Sig Start Date End Date Taking? Authorizing Provider  albuterol (VENTOLIN HFA) 108 (90 Base) MCG/ACT inhaler Inhale 1-2 puffs into the lungs every 6 (six) hours as needed for wheezing or shortness of breath. 07/07/20   Particia Nearing, PA-C  ibuprofen (ADVIL) 600 MG tablet Take 1 tablet (600 mg total) by mouth every 6 (six) hours. 11/05/19   Dale [place]ton, FNP  predniSONE (DELTASONE) 10 MG tablet Take 6 tabs day one, 5 tabs day two, 4 tabs day three, etc 07/07/20   Particia Nearing, PA-C  Prenatal Vit-Fe Fumarate-FA (MULTIVITAMIN-PRENATAL) 27-0.8 MG TABS tablet Take 1 tablet by mouth daily at 12 noon.    [provider]    Allergies    Latex  Review of Systems   Review of Systems  Constitutional: Negative for fatigue and fever.  Musculoskeletal: Positive for arthralgias, gait problem and joint swelling. Negative for back pain, myalgias,  neck pain, neck stiffness and stiffness.  Skin: Negative for itching.  Neurological: Negative for weakness and numbness.    Physical Exam Updated Vital Signs  ED Triage Vitals  Enc Vitals Group     BP 03/13/21 2138 125/88     Pulse Rate 03/13/21 2138 100     Resp 03/13/21 2138 16     Temp 03/13/21 2138 100 F (37.8 C)     Temp Source 03/13/21 2138 Oral     SpO2 03/13/21 2138 100 %     Weight 03/13/21 2139 170 lb (77.1 kg)     Height 03/13/21 2139 5\' 4"  (1.626 m)     Head Circumference --      Peak Flow --      Pain Score 03/13/21 2139 6     Pain Loc --      Pain Edu? --      Excl. in GC? --     Physical Exam Constitutional:      General: She is not in acute distress.    Appearance: She is not ill-appearing.  Cardiovascular:     Pulses: Normal pulses.     Heart sounds: Normal heart sounds.  Musculoskeletal:        General: Swelling and tenderness present. No deformity.     Comments: Tenderness to right ankle with  some swelling around the ankle  Skin:    Capillary Refill: Capillary refill takes less than 2 seconds.  Neurological:     General: No focal deficit present.     Mental Status: She is alert.     Sensory: No sensory deficit.     Motor: No weakness.     ED Results / Procedures / Treatments   Labs (all labs ordered are listed, but only abnormal results are displayed) Labs Reviewed - No data to display  EKG None  Radiology DG Ankle Complete Right  Result Date: 03/13/2021 CLINICAL DATA:  Status post fall. EXAM: RIGHT ANKLE - COMPLETE 3+ VIEW COMPARISON:  None. FINDINGS: There is no evidence of fracture, dislocation, or joint effusion. There is no evidence of arthropathy or other focal bone abnormality. Mild to moderate severity diffuse soft tissue swelling is seen. IMPRESSION: Diffuse soft tissue swelling without an acute osseous abnormality. Electronically Signed   By: 03/15/2021 M.D.   On: 03/13/2021 22:22    Procedures Procedures   Medications Ordered in ED Medications - No data to display  ED Course  I have reviewed the triage vital signs and the nursing notes.  Pertinent labs & imaging results that were available during my care of the patient were reviewed by me and considered in my medical decision making (see chart for details).    MDM Rules/Calculators/A&P                          03/15/2021 is here with right ankle pain after mechanical fall.  Neurovascular neuromuscularly intact.  X-ray showed no fracture.  Overall suspect ankle sprain.  Recommend RICE therapy.  Placed in air splint and given crutches.  Discharged in good condition.  Activity as tolerated.  This chart was dictated using voice recognition software.  Despite best efforts to proofread,  errors can occur which can change the documentation meaning.   Final Clinical Impression(s) / ED Diagnoses Final diagnoses:  Sprain of right ankle, unspecified ligament, initial encounter    Rx / DC Orders ED  Discharge Orders    None  Virgina Norfolk, DO 03/13/21 2315

## 2021-03-13 NOTE — ED Triage Notes (Signed)
Patient BIB [facility] from Home after Patient tripped over [employer] at [employer].   Patient has Pain and Swelling to R. Ankle.  BIB Wheelchair to Triage Area. GCS 15, NAD.

## 2021-03-13 NOTE — Discharge Instructions (Addendum)
Recommend 600 mg of ibuprofen every 8 hours as needed for pain.  Recommend 1000 mg of Tylenol every 6 hours as needed for pain.  Ice often.

## 2021-03-14 ENCOUNTER — Encounter: Payer: Self-pay | Admitting: Family Medicine

## 2021-03-14 ENCOUNTER — Ambulatory Visit (INDEPENDENT_AMBULATORY_CARE_PROVIDER_SITE_OTHER): Payer: Medicaid Other | Admitting: Family Medicine

## 2021-03-14 ENCOUNTER — Ambulatory Visit: Payer: Self-pay

## 2021-03-14 VITALS — BP 120/80 | Ht 65.0 in | Wt 170.0 lb

## 2021-03-14 DIAGNOSIS — M25471 Effusion, right ankle: Secondary | ICD-10-CM | POA: Insufficient documentation

## 2021-03-14 DIAGNOSIS — M25571 Pain in right ankle and joints of right foot: Secondary | ICD-10-CM

## 2021-03-14 NOTE — Patient Instructions (Signed)
Nice to meet you Please try ice  Please try the range of motion movements.  Please try the boot   Please send me a message in MyChart with any questions or updates.  Please see me back in 2-3 weeks.   --Dr. Jordan Likes

## 2021-03-14 NOTE — Progress Notes (Signed)
  Melanie Valencia - 25 y.o. female MRN [id number]  Date of birth: Jan 01, 1996  SUBJECTIVE:  Including CC & ROS.  No chief complaint on file.   Melanie Valencia is a 25 y.o. female that is presenting with right ankle inversion.  Still having significant pain.  She had an inversion injury..  Independent review of the right ankle x-ray from 5/18 shows no acute bony changes.   Review of Systems See HPI   HISTORY: Past Medical, Surgical, Social, and Family History Reviewed & Updated per EMR.   Pertinent Historical Findings include:  Past Medical History:  Diagnosis Date  . Anemia   . Anxiety    denies  . Infection    UTI  . Thalassemia trait, alpha     Past Surgical History:  Procedure Laterality Date  . NO PAST SURGERIES      Family History  Problem Relation Age of Onset  . Cancer Mother        liver  . Asthma Father     Social History   Socioeconomic History  . Marital status: Single    Spouse name: Not on file  . Number of children: Not on file  . Years of education: Not on file  . Highest education level: Not on file  Occupational History  . Not on file  Tobacco Use  . Smoking status: Never Smoker  . Smokeless tobacco: Never Used  Vaping Use  . Vaping Use: Never used  Substance and Sexual Activity  . Alcohol use: Not Currently  . Drug use: No  . Sexual activity: Not Currently    Birth control/protection: None  Other Topics Concern  . Not on file  Social History Narrative  . Not on file   Social Determinants of Health   Financial Resource Strain: Not on file  Food Insecurity: Not on file  Transportation Needs: Not on file  Physical Activity: Not on file  Stress: Not on file  Social Connections: Not on file  Intimate Partner Violence: Not on file     PHYSICAL EXAM:  VS: BP 120/80 (BP Location: Left Arm, Patient Position: Sitting, Cuff Size: Normal)   Ht 5\' 5"  (1.651 m)   Wt 170 lb (77.1 kg)   LMP 02/20/2021   BMI 28.29 kg/m  Physical Exam Gen: NAD,  alert, cooperative with exam, well-appearing MSK:  Right ankle: Limited range of motion. Normal strength resistance. Negative anterior drawer. Neurovascular intact  Limited ultrasound: Right foot and ankle:  Mild effusion within the ankle joint. Encircling effusion of the posterior tib and flexor digitorum  Normal insertion into the navicular of the posterior tibialis. Normal-appearing peroneal tendons. Normal insertion into the base of the fifth of the peroneal brevis. Anechoic circular cystic change appreciated on the lateral aspect of the cuboid. Effusion noted on the subtalar joint medially with hyperemia   Summary: Ankle effusion   Ultrasound and interpretation by 02/22/2021, MD    ASSESSMENT & PLAN:   Ankle effusion, right Injury occurred on 5/18.  Has an ankle effusion as well as several different changes around the ankle itself.  There is changes of the subtalar joint.  He has some color changes around the tarsal tunnel to suspect a retinacular change.  Also has a cystic change of the lateral point of the cuboid. -Counseled on home exercise therapy and supportive care. -Provided Cam walker. -We will consider physical therapy

## 2021-03-14 NOTE — Assessment & Plan Note (Signed)
Injury occurred on 5/18.  Has an ankle effusion as well as several different changes around the ankle itself.  There is changes of the subtalar joint.  He has some color changes around the tarsal tunnel to suspect a retinacular change.  Also has a cystic change of the lateral point of the cuboid. -Counseled on home exercise therapy and supportive care. -Provided Cam walker. -We will consider physical therapy

## 2021-04-04 ENCOUNTER — Other Ambulatory Visit: Payer: Self-pay

## 2021-04-05 ENCOUNTER — Other Ambulatory Visit: Payer: Self-pay

## 2021-04-05 ENCOUNTER — Ambulatory Visit (INDEPENDENT_AMBULATORY_CARE_PROVIDER_SITE_OTHER): Payer: Medicaid Other | Admitting: Family Medicine

## 2021-04-05 ENCOUNTER — Ambulatory Visit: Payer: Self-pay

## 2021-04-05 ENCOUNTER — Encounter: Payer: Self-pay | Admitting: Family Medicine

## 2021-04-05 VITALS — BP 110/72 | Ht 65.0 in | Wt 170.0 lb

## 2021-04-05 DIAGNOSIS — M67471 Ganglion, right ankle and foot: Secondary | ICD-10-CM | POA: Diagnosis not present

## 2021-04-05 DIAGNOSIS — M25471 Effusion, right ankle: Secondary | ICD-10-CM

## 2021-04-05 DIAGNOSIS — M6740 Ganglion, unspecified site: Secondary | ICD-10-CM | POA: Insufficient documentation

## 2021-04-05 NOTE — Progress Notes (Signed)
Melanie Valencia - 25 y.o. female MRN [id number]  Date of birth: 13-Jul-1996  SUBJECTIVE:  Including CC & ROS.  No chief complaint on file.   Melanie Valencia is a 25 y.o. female that is following up for her right ankle pain.  She also would like to have the cyst drained.  It is causing her some pain in this area.   Review of Systems See HPI   HISTORY: Past Medical, Surgical, Social, and Family History Reviewed & Updated per EMR.   Pertinent Historical Findings include:  Past Medical History:  Diagnosis Date   Anemia    Anxiety    denies   Infection    UTI   Thalassemia trait, alpha     Past Surgical History:  Procedure Laterality Date   NO PAST SURGERIES      Family History  Problem Relation Age of Onset   Cancer Mother        liver   Asthma Father     Social History   Socioeconomic History   Marital status: Single    Spouse name: Not on file   Number of children: Not on file   Years of education: Not on file   Highest education level: Not on file  Occupational History   Not on file  Tobacco Use   Smoking status: Never   Smokeless tobacco: Never  Vaping Use   Vaping Use: Never used  Substance and Sexual Activity   Alcohol use: Not Currently   Drug use: No   Sexual activity: Not Currently    Birth control/protection: None  Other Topics Concern   Not on file  Social History Narrative   Not on file   Social Determinants of Health   Financial Resource Strain: Not on file  Food Insecurity: Not on file  Transportation Needs: Not on file  Physical Activity: Not on file  Stress: Not on file  Social Connections: Not on file  Intimate Partner Violence: Not on file     PHYSICAL EXAM:  VS: BP 110/72 (BP Location: Left Arm, Patient Position: Sitting, Cuff Size: Large)   Ht 5\' 5"  (1.651 m)   Wt 170 lb (77.1 kg)   BMI 28.29 kg/m  Physical Exam Gen: NAD, alert, cooperative with exam, well-appearing MSK:  Right ankle: Normal range of motion. Normal strength  resistance. Neurovascular intact   Aspiration/Injection Procedure Note Melanie Valencia 07-08-1996  Procedure: Aspiration Indications: Ganglion cyst of right hip  Procedure Details Consent: Risks of procedure as well as the alternatives and risks of each were explained to the (patient/caregiver).  Consent for procedure obtained. Time Out: Verified patient identification, verified procedure, site/side was marked, verified correct patient position, special equipment/implants available, medications/allergies/relevent history reviewed, required imaging and test results available.  Performed.  The area was cleaned with iodine and alcohol swabs.    The right foot ganglion cyst was injected using 4 cc's of 1% lidocaine with a 25 1 1/2" needle.  An 18-gauge 1-1/2 inch needle was used to achieve aspiration.  Ultrasound was used. Images were obtained in short views showing the injection.    Amount of Fluid Aspirated: minimal amount Character of Fluid: gelatinous Fluid was sent for: n/a  A sterile dressing was applied.  Patient did tolerate procedure well.    ASSESSMENT & PLAN:   Ankle effusion, right Range of motion and pain have been improving. -Counseled on home exercise therapy and supportive care. -Could consider physical therapy.  Ganglion cyst of right foot She reports having  pain on the lateral aspect of the foot. -Aspiration today.

## 2021-04-05 NOTE — Assessment & Plan Note (Signed)
Range of motion and pain have been improving. -Counseled on home exercise therapy and supportive care. -Could consider physical therapy.

## 2021-04-05 NOTE — Patient Instructions (Signed)
Good to see you Please try ice  Please try the exercises   Please send me a message in MyChart with any questions or updates.  Please see me back in 3-4 weeks.   --Dr. Jordan Likes

## 2021-04-05 NOTE — Assessment & Plan Note (Signed)
She reports having pain on the lateral aspect of the foot. -Aspiration today.

## 2021-08-08 ENCOUNTER — Emergency Department (HOSPITAL_COMMUNITY): Payer: Medicaid Other

## 2021-08-08 ENCOUNTER — Other Ambulatory Visit: Payer: Self-pay

## 2021-08-08 ENCOUNTER — Encounter (HOSPITAL_COMMUNITY): Payer: Self-pay

## 2021-08-08 ENCOUNTER — Emergency Department (HOSPITAL_COMMUNITY)
Admission: EM | Admit: 2021-08-08 | Discharge: 2021-08-08 | Disposition: A | Discharge status: Home / Self Care | Payer: Medicaid Other | Attending: Emergency Medicine | Admitting: Emergency Medicine

## 2021-08-08 DIAGNOSIS — J1089 Influenza due to other identified influenza virus with other manifestations: Secondary | ICD-10-CM | POA: Diagnosis not present

## 2021-08-08 DIAGNOSIS — J111 Influenza due to unidentified influenza virus with other respiratory manifestations: Secondary | ICD-10-CM

## 2021-08-08 DIAGNOSIS — R509 Fever, unspecified: Secondary | ICD-10-CM | POA: Diagnosis present

## 2021-08-08 DIAGNOSIS — Z91040 Latex allergy status: Secondary | ICD-10-CM | POA: Diagnosis not present

## 2021-08-08 DIAGNOSIS — Z20822 Contact with and (suspected) exposure to covid-19: Secondary | ICD-10-CM | POA: Diagnosis not present

## 2021-08-08 LAB — CBC WITH DIFFERENTIAL/PLATELET
Abs Immature Granulocytes: 0.03 10*3/uL (ref 0.00–0.07)
Basophils Absolute: 0 10*3/uL (ref 0.0–0.1)
Basophils Relative: 1 %
Eosinophils Absolute: 0 10*3/uL (ref 0.0–0.5)
Eosinophils Relative: 0 %
HCT: 35.6 % — ABNORMAL LOW (ref 36.0–46.0)
Hemoglobin: 12.1 g/dL (ref 12.0–15.0)
Immature Granulocytes: 0 %
Lymphocytes Relative: 12 %
Lymphs Abs: 0.8 10*3/uL (ref 0.7–4.0)
MCH: 27.9 pg (ref 26.0–34.0)
MCHC: 34 g/dL (ref 30.0–36.0)
MCV: 82.2 fL (ref 80.0–100.0)
Monocytes Absolute: 0.5 10*3/uL (ref 0.1–1.0)
Monocytes Relative: 8 %
Neutro Abs: 5.3 10*3/uL (ref 1.7–7.7)
Neutrophils Relative %: 79 %
Platelets: 170 10*3/uL (ref 150–400)
RBC: 4.33 MIL/uL (ref 3.87–5.11)
RDW: 14.6 % (ref 11.5–15.5)
WBC: 6.8 10*3/uL (ref 4.0–10.5)
nRBC: 0 % (ref 0.0–0.2)

## 2021-08-08 LAB — BASIC METABOLIC PANEL
Anion gap: 6 (ref 5–15)
BUN: 6 mg/dL (ref 6–20)
CO2: 20 mmol/L — ABNORMAL LOW (ref 22–32)
Calcium: 8.1 mg/dL — ABNORMAL LOW (ref 8.9–10.3)
Chloride: 112 mmol/L — ABNORMAL HIGH (ref 98–111)
Creatinine, Ser: 0.51 mg/dL (ref 0.44–1.00)
GFR, Estimated: >60 mL/min (ref >60)
Glucose, Bld: 106 mg/dL — ABNORMAL HIGH (ref 70–99)
Potassium: 2.8 mmol/L — ABNORMAL LOW (ref 3.5–5.1)
Sodium: 138 mmol/L (ref 135–145)

## 2021-08-08 LAB — URINALYSIS, ROUTINE W REFLEX MICROSCOPIC
Bilirubin Urine: NEGATIVE
Glucose, UA: NEGATIVE mg/dL
Ketones, ur: NEGATIVE mg/dL
Leukocytes,Ua: NEGATIVE
Nitrite: NEGATIVE
Protein, ur: NEGATIVE mg/dL
Specific Gravity, Urine: 1.009 (ref 1.005–1.030)
pH: 5 (ref 5.0–8.0)

## 2021-08-08 LAB — PREGNANCY, URINE: Preg Test, Ur: NEGATIVE

## 2021-08-08 LAB — RESP PANEL BY RT-PCR (FLU A&B, COVID) ARPGX2
Influenza A by PCR: NEGATIVE
Influenza B by PCR: NEGATIVE
SARS Coronavirus 2 by RT PCR: NEGATIVE

## 2021-08-08 MED ORDER — ACETAMINOPHEN 500 MG PO TABS
1000.0000 mg | ORAL_TABLET | Freq: Once | ORAL | Status: DC
  Filled 2021-08-08: qty 2

## 2021-08-08 MED ORDER — IBUPROFEN 200 MG PO TABS
600.0000 mg | ORAL_TABLET | Freq: Once | ORAL | Status: AC
  Administered 2021-08-08: 600 mg via ORAL
  Filled 2021-08-08: qty 3

## 2021-08-08 MED ORDER — SODIUM CHLORIDE 0.9 % IV BOLUS
1000.0000 mL | Freq: Once | INTRAVENOUS | Status: AC
  Administered 2021-08-08: 1000 mL via INTRAVENOUS

## 2021-08-08 MED ORDER — IOHEXOL 350 MG/ML SOLN
80.0000 mL | Freq: Once | INTRAVENOUS | Status: AC | PRN
  Administered 2021-08-08: 80 mL via INTRAVENOUS

## 2021-08-08 MED ORDER — POTASSIUM CHLORIDE CRYS ER 20 MEQ PO TBCR
40.0000 meq | EXTENDED_RELEASE_TABLET | Freq: Once | ORAL | Status: AC
  Administered 2021-08-08: 40 meq via ORAL
  Filled 2021-08-08: qty 2

## 2021-08-08 MED ORDER — SODIUM CHLORIDE (PF) 0.9 % IJ SOLN
INTRAMUSCULAR | Status: AC
  Filled 2021-08-08: qty 50

## 2021-08-08 NOTE — ED Provider Notes (Signed)
[facility]-EMERGENCY DEPT Provider Note   CSN: [id number] Arrival date & time: 08/08/21  0019     History Chief Complaint  Patient presents with   Fever    Melanie Valencia is a 25 y.o. female.  Patient presents to the emergency department with a chief complaint of fever.  She states that the symptoms started yesterday.  She reports that she recently had a UTI, but was not able to get her antibiotics.  She denies any nausea, vomiting, or diarrhea.  She does report slight sore throat, but denies any productive cough.  She denies any other associated symptoms.  Nothing makes her symptoms better or worse.  The history is provided by the patient. No language interpreter was used.      Past Medical History:  Diagnosis Date   Anemia    Anxiety    denies   Infection    UTI   Thalassemia trait, alpha     Patient Active Problem List   Diagnosis Date Noted   Ganglion cyst of right foot 04/05/2021   Ankle effusion, right 03/14/2021   Post-dates pregnancy 11/03/2019   PPH (postpartum hemorrhage) 11/03/2019   GBS (group B Streptococcus carrier), +RV culture, currently pregnant 11/02/2019   Anemia of pregnancy 11/02/2019   Alpha thalassemia trait 11/02/2019   Encounter for planned induction of labor 11/02/2019   RhD negative 12/24/2018    Past Surgical History:  Procedure Laterality Date   NO PAST SURGERIES       OB History     Gravida  2   Para  1   Term  1   Preterm      AB  1   Living  1      SAB  1   IAB      Ectopic      Multiple  0   Live Births  1           Family History  Problem Relation Age of Onset   Cancer Mother        liver   Asthma Father     Social History   Tobacco Use   Smoking status: Never   Smokeless tobacco: Never  Vaping Use   Vaping Use: Never used  Substance Use Topics   Alcohol use: Not Currently   Drug use: No    Home Medications Prior to Admission medications   Medication Sig Start  Date End Date Taking? Authorizing Provider  albuterol (VENTOLIN HFA) 108 (90 Base) MCG/ACT inhaler Inhale 1-2 puffs into the lungs every 6 (six) hours as needed for wheezing or shortness of breath. 07/07/20   Particia Nearing, PA-C  ibuprofen (ADVIL) 600 MG tablet Take 1 tablet (600 mg total) by mouth every 6 (six) hours. 11/05/19   Dale [place], FNP  predniSONE (DELTASONE) 10 MG tablet Take 6 tabs day one, 5 tabs day two, 4 tabs day three, etc 07/07/20   Particia Nearing, PA-C  Prenatal Vit-Fe Fumarate-FA (MULTIVITAMIN-PRENATAL) 27-0.8 MG TABS tablet Take 1 tablet by mouth daily at 12 noon.    [provider]    Allergies    Latex  Review of Systems   Review of Systems  All other systems reviewed and are negative.  Physical Exam Updated Vital Signs BP 120/65   Pulse (!) 112   Temp 99.8 F (37.7 C) (Oral)   Resp 16   Ht 5\' 4"  (1.626 m)   Wt 91.6 kg   LMP 07/22/2021 Comment:  negative urine pregnancy test 08/08/21  SpO2 98%   BMI 34.67 kg/m   Physical Exam Vitals and nursing note reviewed.  Constitutional:      General: She is not in acute distress.    Appearance: She is well-developed.  HENT:     Head: Normocephalic and atraumatic.     Mouth/Throat:     Mouth: Mucous membranes are moist.     Pharynx: Oropharynx is clear. No oropharyngeal exudate.  Eyes:     Conjunctiva/sclera: Conjunctivae normal.  Cardiovascular:     Rate and Rhythm: Normal rate and regular rhythm.     Heart sounds: No murmur heard. Pulmonary:     Effort: Pulmonary effort is normal. No respiratory distress.     Breath sounds: Normal breath sounds.  Abdominal:     Palpations: Abdomen is soft.     Tenderness: There is no abdominal tenderness.  Musculoskeletal:        General: Normal range of motion.     Cervical back: Neck supple.  Skin:    General: Skin is warm and dry.  Neurological:     Mental Status: She is alert and oriented to person, place, and time.  Psychiatric:         Mood and Affect: Mood normal.        Behavior: Behavior normal.    ED Results / Procedures / Treatments   Labs (all labs ordered are listed, but only abnormal results are displayed) Labs Reviewed  CBC WITH DIFFERENTIAL/PLATELET - Abnormal; Notable for the following components:      Result Value   HCT 35.6 (*)    All other components within normal limits  BASIC METABOLIC PANEL - Abnormal; Notable for the following components:   Potassium 2.8 (*)    Chloride 112 (*)    CO2 20 (*)    Glucose, Bld 106 (*)    Calcium 8.1 (*)    All other components within normal limits  URINALYSIS, ROUTINE W REFLEX MICROSCOPIC - Abnormal; Notable for the following components:   Color, Urine STRAW (*)    Hgb urine dipstick MODERATE (*)    Bacteria, UA RARE (*)    All other components within normal limits  RESP PANEL BY RT-PCR (FLU A&B, COVID) ARPGX2  PREGNANCY, URINE    EKG None  Radiology CT ABDOMEN PELVIS W CONTRAST  Result Date: 08/08/2021 CLINICAL DATA:  Abdominal pain, fever, microhematuria EXAM: CT ABDOMEN AND PELVIS WITH CONTRAST TECHNIQUE: Multidetector CT imaging of the abdomen and pelvis was performed using the standard protocol following bolus administration of intravenous contrast. CONTRAST:  88mL OMNIPAQUE IOHEXOL 350 MG/ML SOLN COMPARISON:  None. FINDINGS: Lower chest: No acute abnormality. Hepatobiliary: No focal liver abnormality is seen. No gallstones, gallbladder wall thickening, or biliary dilatation. Pancreas: Unremarkable Spleen: Unremarkable Adrenals/Urinary Tract: Adrenal glands are unremarkable. Kidneys are normal, without renal calculi, focal lesion, or hydronephrosis. Bladder is unremarkable. Stomach/Bowel: Moderate pancolonic diverticulosis is present. The stomach, small bowel, and large bowel are otherwise unremarkable. No evidence of obstruction or focal inflammation. No free intraperitoneal gas. Vascular/Lymphatic: No significant vascular findings are present. No enlarged  abdominal or pelvic lymph nodes. Reproductive: Corpus luteum noted within the right ovary. The pelvic organs are otherwise unremarkable. Other: Tiny fat containing umbilical hernia. Tiny fat containing right inguinal hernia. Musculoskeletal: No acute bone abnormality. No lytic or blastic bone lesion. IMPRESSION: No acute intra-abdominal pathology identified. No definite radiographic explanation for the patient's reported symptoms. Moderate pancolonic diverticulosis. Electronically Signed   By: Gloris Ham  Ramiro Harvest M.D.   On: 08/08/2021 03:38   DG Chest Port 1 View  Result Date: 08/08/2021 CLINICAL DATA:  Sudden onset fever and weakness. EXAM: PORTABLE CHEST 1 VIEW COMPARISON:  07/07/2020 FINDINGS: Shallow inspiration. Heart size and pulmonary vascularity are normal. Lungs are clear. No pleural effusions. No pneumothorax. Mediastinal contours appear intact. IMPRESSION: Shallow inspiration.  No evidence of active pulmonary disease. Electronically Signed   By: Burman Nieves M.D.   On: 08/08/2021 01:07    Procedures Procedures   Medications Ordered in ED Medications  sodium chloride (PF) 0.9 % injection (has no administration in time range)  potassium chloride SA (KLOR-CON) CR tablet 40 mEq (40 mEq Oral Given 08/08/21 0147)  ibuprofen (ADVIL) tablet 600 mg (600 mg Oral Given 08/08/21 0147)  sodium chloride 0.9 % bolus 1,000 mL (0 mLs Intravenous Stopped 08/08/21 0347)  iohexol (OMNIPAQUE) 350 MG/ML injection 80 mL (80 mLs Intravenous Contrast Given 08/08/21 0311)  sodium chloride 0.9 % bolus 1,000 mL (1,000 mLs Intravenous New Bag/Given 08/08/21 0419)    ED Course  I have reviewed the triage vital signs and the nursing notes.  Pertinent labs & imaging results that were available during my care of the patient were reviewed by me and considered in my medical decision making (see chart for details).    MDM Rules/Calculators/A&P                           Patient here with COVID or flulike  symptoms.  Has had sore throat and fever and some body aches.  Symptoms started yesterday.  COVID and flu tests are negative, but I do suspect that this is the likely etiology of the patient's symptoms.  Patient's potassium was a little low at 2.8, I did give her potassium replacement here.  She did not have any significant leukocytosis.  CT of the abdomen/pelvis and chest x-ray were unremarkable.  Urinalysis was negative.  Patient does not appear toxic.  I suspect that her symptoms are viral.  Return precautions discussed.  Final Clinical Impression(s) / ED Diagnoses Final diagnoses:  Influenza-like illness    Rx / DC Orders ED Discharge Orders     None        Roxy Horseman, PA-C 08/08/21 0505    Dione Booze, MD 08/08/21 [phone]

## 2021-08-08 NOTE — Discharge Instructions (Addendum)
No clear source for your symptoms was found tonight.  I recommend that you take a repeat home COVID test tomorrow and the next day.  Should this turn positive, that would explain your symptoms.  If you worsen, please return to the emergency department otherwise, please follow-up with your doctor in 3 to 5 days.  Get plenty of rest and stay hydrated.

## 2021-08-08 NOTE — ED Triage Notes (Signed)
Pt BIB EMS from home c/o Fever started yesterday. Pt report she had UTI a month ago but unable to take her antibiotics. Pt denies N/V/D. 500 ml NS given by EMS. Pt a/ox4.  Temp 100.6 BP 144/86 HR 115 RR 20 O2sat 98% on RA CBG 196

## 2022-03-16 ENCOUNTER — Encounter (HOSPITAL_COMMUNITY): Payer: Self-pay | Admitting: Emergency Medicine

## 2022-03-16 ENCOUNTER — Emergency Department (HOSPITAL_COMMUNITY)
Admission: EM | Admit: 2022-03-16 | Discharge: 2022-03-16 | Disposition: A | Discharge status: Home / Self Care | Payer: Medicaid Other | Attending: Emergency Medicine | Admitting: Emergency Medicine

## 2022-03-16 DIAGNOSIS — J029 Acute pharyngitis, unspecified: Secondary | ICD-10-CM | POA: Diagnosis present

## 2022-03-16 DIAGNOSIS — J45909 Unspecified asthma, uncomplicated: Secondary | ICD-10-CM | POA: Insufficient documentation

## 2022-03-16 DIAGNOSIS — Z91040 Latex allergy status: Secondary | ICD-10-CM | POA: Insufficient documentation

## 2022-03-16 DIAGNOSIS — Z7952 Long term (current) use of systemic steroids: Secondary | ICD-10-CM | POA: Insufficient documentation

## 2022-03-16 LAB — GROUP A STREP BY PCR: Group A Strep by PCR: NOT DETECTED

## 2022-03-16 MED ORDER — IBUPROFEN 800 MG PO TABS
800.0000 mg | ORAL_TABLET | Freq: Once | ORAL | Status: AC
  Administered 2022-03-16: 800 mg via ORAL
  Filled 2022-03-16: qty 1

## 2022-03-16 MED ORDER — CEPHALEXIN 500 MG PO CAPS: 500.0000 mg | ORAL_CAPSULE | Freq: Four times a day (QID) | ORAL | 0 refills | Status: AC

## 2022-03-16 NOTE — Discharge Instructions (Signed)
Take ibuprofen 600 mg every 6 hours as needed for pain.  Plenty of fluids and get plenty of rest.  If symptoms are not improving in the next 2 to 3 days, fill the prescription for Keflex you have been provided with this morning.  Follow-up with primary doctor if not improving in the next week.

## 2022-03-16 NOTE — ED Provider Notes (Signed)
[facility]-EMERGENCY DEPT Provider Note   CSN: [id number] Arrival date & time: 03/16/22  1540     History  Chief Complaint  Patient presents with   Sore Throat    Melanie Valencia is a 26 y.o. female.  Patient is a 26 year old female with history of asthma.  She presents today with complaints of sore throat.  This has been worsening over the past week.  She has tried numerous over-the-counter medications with little relief.  She describes some cough that is nonproductive.  She denies fevers or chills.  She denies ill contacts.  The history is provided by the patient.      Home Medications Prior to Admission medications   Medication Sig Start Date End Date Taking? Authorizing Provider  albuterol (VENTOLIN HFA) 108 (90 Base) MCG/ACT inhaler Inhale 1-2 puffs into the lungs every 6 (six) hours as needed for wheezing or shortness of breath. 07/07/20   Particia Nearing, PA-C  ibuprofen (ADVIL) 600 MG tablet Take 1 tablet (600 mg total) by mouth every 6 (six) hours. 11/05/19   Dale [place], FNP  predniSONE (DELTASONE) 10 MG tablet Take 6 tabs day one, 5 tabs day two, 4 tabs day three, etc 07/07/20   Particia Nearing, PA-C  Prenatal Vit-Fe Fumarate-FA (MULTIVITAMIN-PRENATAL) 27-0.8 MG TABS tablet Take 1 tablet by mouth daily at 12 noon.    [provider]      Allergies    Latex    Review of Systems   Review of Systems  All other systems reviewed and are negative.  Physical Exam Updated Vital Signs BP (!) 163/75 (BP Location: Right Arm)   Pulse 99   Temp 98.4 F (36.9 C) (Oral)   Resp 18   Ht 5\' 4"  (1.626 m)   Wt 90.7 kg   SpO2 95%   BMI 34.33 kg/m  Physical Exam Vitals and nursing note reviewed.  Constitutional:      General: She is not in acute distress.    Appearance: She is well-developed. She is not diaphoretic.  HENT:     Head: Normocephalic and atraumatic.     Right Ear: Tympanic membrane normal.     Left Ear: Tympanic  membrane normal.     Mouth/Throat:     Mouth: Mucous membranes are moist.     Pharynx: Posterior oropharyngeal erythema present. No oropharyngeal exudate.     Tonsils: No tonsillar exudate or tonsillar abscesses.  Cardiovascular:     Rate and Rhythm: Normal rate and regular rhythm.     Heart sounds: No murmur heard.   No friction rub. No gallop.  Pulmonary:     Effort: Pulmonary effort is normal. No respiratory distress.     Breath sounds: Normal breath sounds. No wheezing.  Abdominal:     General: Bowel sounds are normal. There is no distension.     Palpations: Abdomen is soft.     Tenderness: There is no abdominal tenderness.  Musculoskeletal:        General: Normal range of motion.     Cervical back: Normal range of motion and neck supple.  Lymphadenopathy:     Cervical: No cervical adenopathy.  Skin:    General: Skin is warm and dry.  Neurological:     General: No focal deficit present.     Mental Status: She is alert and oriented to person, place, and time.    ED Results / Procedures / Treatments   Labs (all labs ordered are listed, but only  abnormal results are displayed) Labs Reviewed  GROUP A STREP BY PCR    EKG None  Radiology No results found.  Procedures Procedures    Medications Ordered in ED Medications  ibuprofen (ADVIL) tablet 800 mg (has no administration in time range)    ED Course/ Medical Decision Making/ A&P  Patient presenting with a 1 week history of sore throat.  Strep test today is negative and symptoms most likely are viral.  Patient not having much relief with over-the-counter medications.  She will be given Motrin here.  Patient will be prescribed Keflex which she is to fill if her sore throat is not improving in the next 3 days.  Final Clinical Impression(s) / ED Diagnoses Final diagnoses:  None    Rx / DC Orders ED Discharge Orders     None         Geoffery Lyons, MD 03/16/22 [phone]

## 2022-03-16 NOTE — ED Triage Notes (Addendum)
Pt reports sore throat for about a week. States that it gets worse at night. No fever, sick contacts, SOB, or body aches. No congestion. Reports coughing up mucus at night. States that nyquil usually helps, but hasn't.

## 2022-03-27 ENCOUNTER — Emergency Department (HOSPITAL_COMMUNITY)
Admission: EM | Admit: 2022-03-27 | Discharge: 2022-03-27 | Disposition: A | Discharge status: Home / Self Care | Payer: Medicaid Other | Attending: Emergency Medicine | Admitting: Emergency Medicine

## 2022-03-27 ENCOUNTER — Encounter (HOSPITAL_COMMUNITY): Payer: Self-pay

## 2022-03-27 ENCOUNTER — Other Ambulatory Visit: Payer: Self-pay

## 2022-03-27 DIAGNOSIS — Z91040 Latex allergy status: Secondary | ICD-10-CM | POA: Insufficient documentation

## 2022-03-27 DIAGNOSIS — J029 Acute pharyngitis, unspecified: Secondary | ICD-10-CM | POA: Insufficient documentation

## 2022-03-27 DIAGNOSIS — R1310 Dysphagia, unspecified: Secondary | ICD-10-CM | POA: Insufficient documentation

## 2022-03-27 LAB — CBC WITH DIFFERENTIAL/PLATELET
Abs Immature Granulocytes: 0.01 10*3/uL (ref 0.00–0.07)
Basophils Absolute: 0.1 10*3/uL (ref 0.0–0.1)
Basophils Relative: 1 %
Eosinophils Absolute: 0.1 10*3/uL (ref 0.0–0.5)
Eosinophils Relative: 1 %
HCT: 39.7 % (ref 36.0–46.0)
Hemoglobin: 13 g/dL (ref 12.0–15.0)
Immature Granulocytes: 0 %
Lymphocytes Relative: 46 %
Lymphs Abs: 3.1 10*3/uL (ref 0.7–4.0)
MCH: 27.8 pg (ref 26.0–34.0)
MCHC: 32.7 g/dL (ref 30.0–36.0)
MCV: 84.8 fL (ref 80.0–100.0)
Monocytes Absolute: 0.3 10*3/uL (ref 0.1–1.0)
Monocytes Relative: 5 %
Neutro Abs: 3.1 10*3/uL (ref 1.7–7.7)
Neutrophils Relative %: 47 %
Platelets: 226 10*3/uL (ref 150–400)
RBC: 4.68 MIL/uL (ref 3.87–5.11)
RDW: 13.9 % (ref 11.5–15.5)
WBC: 6.7 10*3/uL (ref 4.0–10.5)
nRBC: 0 % (ref 0.0–0.2)

## 2022-03-27 LAB — I-STAT BETA HCG BLOOD, ED (MC, WL, AP ONLY): I-stat hCG, quantitative: <5 m[IU]/mL (ref <5)

## 2022-03-27 LAB — COMPREHENSIVE METABOLIC PANEL
ALT: 16 U/L (ref 0–44)
AST: 14 U/L — ABNORMAL LOW (ref 15–41)
Albumin: 4 g/dL (ref 3.5–5.0)
Alkaline Phosphatase: 85 U/L (ref 38–126)
Anion gap: 8 (ref 5–15)
BUN: 8 mg/dL (ref 6–20)
CO2: 24 mmol/L (ref 22–32)
Calcium: 9.5 mg/dL (ref 8.9–10.3)
Chloride: 106 mmol/L (ref 98–111)
Creatinine, Ser: 0.57 mg/dL (ref 0.44–1.00)
GFR, Estimated: >60 mL/min (ref >60)
Glucose, Bld: 136 mg/dL — ABNORMAL HIGH (ref 70–99)
Potassium: 3.8 mmol/L (ref 3.5–5.1)
Sodium: 138 mmol/L (ref 135–145)
Total Bilirubin: 0.4 mg/dL (ref 0.3–1.2)
Total Protein: 7.3 g/dL (ref 6.5–8.1)

## 2022-03-27 LAB — MONONUCLEOSIS SCREEN: Mono Screen: NEGATIVE

## 2022-03-27 NOTE — ED Notes (Signed)
Pt seen at [facility] for sore throat 1wk ago. Given abx with no relief. Throat red. Tongue with extra piece towards the back R side.

## 2022-03-27 NOTE — ED Notes (Signed)
Pt given crackers and soda. 

## 2022-03-27 NOTE — ED Triage Notes (Signed)
Pt arrived POV from home c/o a sore throat x2 weeks. Pt states she was swabbed for strep throat about a week ago and it was negative but the pain has not went away.

## 2022-03-27 NOTE — ED Provider Notes (Signed)
[facility] EMERGENCY DEPARTMENT Provider Note   CSN: [id number] Arrival date & time: 03/27/22  1056     History  Chief Complaint  Patient presents with   Sore Throat    Melanie Valencia is a 26 y.o. female who presents today for evaluation of ongoing pharyngitis. She was seen on 03/16/2022 for the same.  At that point she had a negative strep test however was given Keflex which she reports she completed 2 days ago with no change in her symptoms. She notes that at the time she developed symptoms her son also was complaining of sore throat. She notes that she did acrylic nails inside the house and wonders if that may have caused inflammation or irritation. She has felt hot a few times but denies any measured fevers.  She denies difficulty breathing.  She does have pain with swallowing.  She has not tried any allergy medications for her symptoms.  She is unsure if she has postnasal drainage.  She reports feeling fatigued.  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  She denies any significant changes in it recently.  HPI     Home Medications Prior to Admission medications   Medication Sig Start Date End Date Taking? Authorizing Provider  albuterol (VENTOLIN HFA) 108 (90 Base) MCG/ACT inhaler Inhale 1-2 puffs into the lungs every 6 (six) hours as needed for wheezing or shortness of breath. 07/07/20   Particia Nearing, PA-C  cephALEXin (KEFLEX) 500 MG capsule Take 1 capsule (500 mg total) by mouth 4 (four) times daily. 03/16/22   Geoffery Lyons, MD  ibuprofen (ADVIL) 600 MG tablet Take 1 tablet (600 mg total) by mouth every 6 (six) hours. 11/05/19   Dale [place], FNP  predniSONE (DELTASONE) 10 MG tablet Take 6 tabs day one, 5 tabs day two, 4 tabs day three, etc 07/07/20   Particia Nearing, PA-C  Prenatal Vit-Fe Fumarate-FA (MULTIVITAMIN-PRENATAL) 27-0.8 MG TABS tablet Take 1 tablet by mouth daily at 12 noon.     [provider]      Allergies    Latex    Review of Systems   Review of Systems  Physical Exam Updated Vital Signs BP (!) 142/88 (BP Location: Left Arm)   Pulse (!) 104   Temp 99.2 F (37.3 C) (Oral)   Resp 16   Ht 5\' 5"  (1.651 m)   Wt 96.6 kg   SpO2 100%   BMI 35.45 kg/m  Physical Exam Vitals and nursing note reviewed.  Constitutional:      General: She is not in acute distress.    Appearance: She is not ill-appearing.  HENT:     Head: Normocephalic.     Mouth/Throat:     Mouth: Oral lesions (On the tongue there is a sub CM growth on the right side of the tongue posteriorly.) present.     Pharynx: Uvula midline. No pharyngeal swelling, oropharyngeal exudate, posterior oropharyngeal erythema or uvula swelling.     Tonsils: No tonsillar exudate or tonsillar abscesses. 1+ on the right. 1+ on the left.  Eyes:     Conjunctiva/sclera: Conjunctivae normal.  Cardiovascular:     Rate and Rhythm: Normal rate.  Pulmonary:     Effort: Pulmonary effort is normal. No respiratory distress.     Breath sounds: No stridor.  Skin:    General: Skin is warm and dry.  Neurological:     Mental Status: She is  alert.  Psychiatric:        Mood and Affect: Mood normal.        Behavior: Behavior normal.    ED Results / Procedures / Treatments   Labs (all labs ordered are listed, but only abnormal results are displayed) Labs Reviewed  COMPREHENSIVE METABOLIC PANEL - Abnormal; Notable for the following components:      Result Value   Glucose, Bld 136 (*)    AST 14 (*)    All other components within normal limits  MONONUCLEOSIS SCREEN  CBC WITH DIFFERENTIAL/PLATELET  I-STAT BETA HCG BLOOD, ED ([facility], [facility], [facility] ONLY)    EKG None  Radiology No results found.  Procedures Procedures    Medications Ordered in ED Medications - No data to display  ED Course/ Medical Decision Making/ A&P Clinical Course as of 03/28/22 2059  Thu Mar 27, 2022  1415 WBC: 6.7 No  significant leukocytosis [EH]  1415 Hemoglobin: 13.0 No anemia [EH]  1415 Comprehensive metabolic panel(!) No significant derangements [EH]  1416 I-Stat beta hCG blood, ED Pregnancy test is negative [EH]  1418 Mononucleosis screen [EH]    Clinical Course User Index [EH] Cristina Gong, PA-C                           Medical Decision Making Amount and/or Complexity of Data Reviewed Labs: ordered. Decision-making details documented in ED Course.   This patient presents to the ED for concern of sore throat, this involves an extensive number of treatment options, and is a complaint that carries with it a high risk of complications and morbidity.  The differential diagnosis includes    Additional history obtained:  Additional history obtained from chart review from previous ED encounter   Lab Tests:  I Ordered, and personally interpreted labs.  The pertinent results include:   Monospot is negative.  CMP and CBC are unremarkable.  Pregnancy test is not elevated.  Problem List / ED Course / Critical interventions / Medication management  Sore throat I have reviewed the patients home medicines and have made adjustments as needed   Social Determinants of Health:  No primary care doctor   Test / Admission - Considered:  Given that patient just finished antibiotics and did not have improvement I doubt she has a bacterial pharyngitis.  Her physical exam is reassuring with the exception of the small growth on her tongue that she states is chronic Recommended treatment for seasonal rhinitis/postnasal drip in addition to ENT follow-up. Radiology is not indicated at this time.  Return precautions were discussed with patient who states their understanding.  At the time of discharge patient denied any unaddressed complaints or concerns.  Patient is agreeable for discharge home.  Note: Portions of this report may have been transcribed using voice recognition software. Every  effort was made to ensure accuracy; however, inadvertent computerized transcription errors may be present          Final Clinical Impression(s) / ED Diagnoses Final diagnoses:  Sore throat    Rx / DC Orders ED Discharge Orders     None         Norman Clay 03/28/22 2059    Cheryll Cockayne, MD 04/06/22 2206

## 2022-03-27 NOTE — Discharge Instructions (Signed)
As we discussed today your lab work was reassuring.  Your monoscreen was negative. If you develop shortness of breath, or you are unable to swallow your own saliva or water, or have any new or concerning symptoms please seek additional medical care and evaluation. I would recommend that you start taking either Allegra, Claritin, or Zyrtec every night to try and help with any allergy symptoms she may be having. I would also recommend that you start using a nasal steroid spray such as Flonase or Nasacort every day.

## 2022-12-04 ENCOUNTER — Emergency Department (HOSPITAL_BASED_OUTPATIENT_CLINIC_OR_DEPARTMENT_OTHER)
Admission: EM | Admit: 2022-12-04 | Discharge: 2022-12-05 | Disposition: A | Discharge status: Home / Self Care | Payer: Medicaid Other | Attending: Emergency Medicine | Admitting: Emergency Medicine

## 2022-12-04 ENCOUNTER — Other Ambulatory Visit: Payer: Self-pay

## 2022-12-04 ENCOUNTER — Emergency Department (HOSPITAL_BASED_OUTPATIENT_CLINIC_OR_DEPARTMENT_OTHER): Payer: Medicaid Other | Admitting: Radiology

## 2022-12-04 ENCOUNTER — Encounter (HOSPITAL_BASED_OUTPATIENT_CLINIC_OR_DEPARTMENT_OTHER): Payer: Self-pay | Admitting: Emergency Medicine

## 2022-12-04 DIAGNOSIS — Z20822 Contact with and (suspected) exposure to covid-19: Secondary | ICD-10-CM | POA: Insufficient documentation

## 2022-12-04 DIAGNOSIS — E876 Hypokalemia: Secondary | ICD-10-CM | POA: Insufficient documentation

## 2022-12-04 DIAGNOSIS — R079 Chest pain, unspecified: Secondary | ICD-10-CM

## 2022-12-04 DIAGNOSIS — B9689 Other specified bacterial agents as the cause of diseases classified elsewhere: Secondary | ICD-10-CM | POA: Diagnosis not present

## 2022-12-04 DIAGNOSIS — B349 Viral infection, unspecified: Secondary | ICD-10-CM | POA: Insufficient documentation

## 2022-12-04 DIAGNOSIS — N760 Acute vaginitis: Secondary | ICD-10-CM | POA: Insufficient documentation

## 2022-12-04 DIAGNOSIS — R0789 Other chest pain: Secondary | ICD-10-CM | POA: Insufficient documentation

## 2022-12-04 DIAGNOSIS — Z91040 Latex allergy status: Secondary | ICD-10-CM | POA: Insufficient documentation

## 2022-12-04 DIAGNOSIS — N898 Other specified noninflammatory disorders of vagina: Secondary | ICD-10-CM | POA: Diagnosis present

## 2022-12-04 LAB — GROUP A STREP BY PCR: Group A Strep by PCR: NOT DETECTED

## 2022-12-04 LAB — CBC WITH DIFFERENTIAL/PLATELET
Abs Immature Granulocytes: 0.01 10*3/uL (ref 0.00–0.07)
Basophils Absolute: 0.1 10*3/uL (ref 0.0–0.1)
Basophils Relative: 1 %
Eosinophils Absolute: 0.2 10*3/uL (ref 0.0–0.5)
Eosinophils Relative: 3 %
HCT: 36.2 % (ref 36.0–46.0)
Hemoglobin: 12.1 g/dL (ref 12.0–15.0)
Immature Granulocytes: 0 %
Lymphocytes Relative: 50 %
Lymphs Abs: 3.9 10*3/uL (ref 0.7–4.0)
MCH: 28 pg (ref 26.0–34.0)
MCHC: 33.4 g/dL (ref 30.0–36.0)
MCV: 83.8 fL (ref 80.0–100.0)
Monocytes Absolute: 0.6 10*3/uL (ref 0.1–1.0)
Monocytes Relative: 7 %
Neutro Abs: 3.1 10*3/uL (ref 1.7–7.7)
Neutrophils Relative %: 39 %
Platelets: 220 10*3/uL (ref 150–400)
RBC: 4.32 MIL/uL (ref 3.87–5.11)
RDW: 13.9 % (ref 11.5–15.5)
WBC: 7.8 10*3/uL (ref 4.0–10.5)
nRBC: 0 % (ref 0.0–0.2)

## 2022-12-04 LAB — RESP PANEL BY RT-PCR (RSV, FLU A&B, COVID)  RVPGX2
Influenza A by PCR: NEGATIVE
Influenza B by PCR: NEGATIVE
Resp Syncytial Virus by PCR: NEGATIVE
SARS Coronavirus 2 by RT PCR: NEGATIVE

## 2022-12-04 NOTE — ED Notes (Signed)
Pt ambulatory from waiting room to exam room -- gait steady; no obvious distress noted.

## 2022-12-04 NOTE — ED Notes (Signed)
Patient transported to X-ray 

## 2022-12-04 NOTE — ED Triage Notes (Signed)
Cough nasal and chest congestion started a few days ago.sore throat

## 2022-12-04 NOTE — ED Notes (Signed)
Late entry -- Pt ambulatory from xray escorted by [facility]; no acute changes/distress noted.

## 2022-12-05 LAB — COMPREHENSIVE METABOLIC PANEL
ALT: 11 U/L (ref 0–44)
AST: 10 U/L — ABNORMAL LOW (ref 15–41)
Albumin: 4.4 g/dL (ref 3.5–5.0)
Alkaline Phosphatase: 100 U/L (ref 38–126)
Anion gap: 8 (ref 5–15)
BUN: 5 mg/dL — ABNORMAL LOW (ref 6–20)
CO2: 30 mmol/L (ref 22–32)
Calcium: 9.8 mg/dL (ref 8.9–10.3)
Chloride: 101 mmol/L (ref 98–111)
Creatinine, Ser: 0.63 mg/dL (ref 0.44–1.00)
GFR, Estimated: >60 mL/min (ref >60)
Glucose, Bld: 91 mg/dL (ref 70–99)
Potassium: 3.1 mmol/L — ABNORMAL LOW (ref 3.5–5.1)
Sodium: 139 mmol/L (ref 135–145)
Total Bilirubin: 0.2 mg/dL — ABNORMAL LOW (ref 0.3–1.2)
Total Protein: 6.9 g/dL (ref 6.5–8.1)

## 2022-12-05 LAB — WET PREP, GENITAL
Sperm: NONE SEEN
Trich, Wet Prep: NONE SEEN
WBC, Wet Prep HPF POC: >=10 — AB (ref <10)
Yeast Wet Prep HPF POC: NONE SEEN

## 2022-12-05 LAB — URINALYSIS, ROUTINE W REFLEX MICROSCOPIC
Bilirubin Urine: NEGATIVE
Glucose, UA: NEGATIVE mg/dL
Hgb urine dipstick: NEGATIVE
Ketones, ur: NEGATIVE mg/dL
Leukocytes,Ua: NEGATIVE
Nitrite: NEGATIVE
Protein, ur: NEGATIVE mg/dL
Specific Gravity, Urine: 1.015 (ref 1.005–1.030)
pH: 7 (ref 5.0–8.0)

## 2022-12-05 LAB — HCG, QUANTITATIVE, PREGNANCY: hCG, Beta Chain, Quant, S: 1 m[IU]/mL (ref <5)

## 2022-12-05 LAB — TROPONIN I (HIGH SENSITIVITY)
Troponin I (High Sensitivity): 2 ng/L (ref <18)
Troponin I (High Sensitivity): <2 ng/L (ref <18)

## 2022-12-05 MED ORDER — METRONIDAZOLE 500 MG PO TABS: 500.0000 mg | ORAL_TABLET | Freq: Two times a day (BID) | ORAL | 0 refills | Status: AC

## 2022-12-05 MED ORDER — METRONIDAZOLE 500 MG PO TABS: 500.0000 mg | ORAL_TABLET | Freq: Two times a day (BID) | ORAL | 0 refills | Status: DC

## 2022-12-05 MED ORDER — POTASSIUM CHLORIDE CRYS ER 20 MEQ PO TBCR
40.0000 meq | EXTENDED_RELEASE_TABLET | Freq: Once | ORAL | Status: AC
  Administered 2022-12-05: 40 meq via ORAL
  Filled 2022-12-05: qty 2

## 2022-12-05 NOTE — ED Notes (Addendum)
Pt agreeable with d/c plan as discussed by provider- this nurse has verbally reinforced d/c instructions and provided pt with written copy-- pt acknowledges verbal understanding and denies any addl questions concerns needs - pt ambulatory independently at d/c with steady gait; vitals stable; no distress noted.

## 2022-12-05 NOTE — ED Provider Notes (Signed)
[facility] Provider Note   CSN: [id number] Arrival date & time: 12/04/22  1935     History {Add pertinent medical, surgical, social history, OB history to HPI:1} Chief Complaint  Patient presents with   Cough    Melanie Valencia is a 27 y.o. female.   Cough      Home Medications Prior to Admission medications   Medication Sig Start Date End Date Taking? Authorizing Provider  albuterol (VENTOLIN HFA) 108 (90 Base) MCG/ACT inhaler Inhale 1-2 puffs into the lungs every 6 (six) hours as needed for wheezing or shortness of breath. 07/07/20   Volney American, PA-C  cephALEXin (KEFLEX) 500 MG capsule Take 1 capsule (500 mg total) by mouth 4 (four) times daily. 03/16/22   Veryl Speak, MD  ibuprofen (ADVIL) 600 MG tablet Take 1 tablet (600 mg total) by mouth every 6 (six) hours. 11/05/19   Noralyn Pick, FNP  predniSONE (DELTASONE) 10 MG tablet Take 6 tabs day one, 5 tabs day two, 4 tabs day three, etc 07/07/20   Volney American, PA-C  Prenatal Vit-Fe Fumarate-FA (MULTIVITAMIN-PRENATAL) 27-0.8 MG TABS tablet Take 1 tablet by mouth daily at 12 noon.    [provider]      Allergies    Latex    Review of Systems   Review of Systems  Respiratory:  Positive for cough.     Physical Exam Updated Vital Signs BP 136/78   Pulse 78   Temp 98.2 F (36.8 C) (Oral)   Resp 17   LMP 11/20/2022   SpO2 100%  Physical Exam  ED Results / Procedures / Treatments   Labs (all labs ordered are listed, but only abnormal results are displayed) Labs Reviewed  COMPREHENSIVE METABOLIC PANEL - Abnormal; Notable for the following components:      Result Value   Potassium 3.1 (*)    BUN 5 (*)    AST 10 (*)    Total Bilirubin 0.2 (*)    All other components within normal limits  RESP PANEL BY RT-PCR (RSV, FLU A&B, COVID)  [id number]  GROUP A STREP BY PCR  CBC WITH DIFFERENTIAL/PLATELET  HCG, QUANTITATIVE, PREGNANCY  URINALYSIS, ROUTINE  W REFLEX MICROSCOPIC  TROPONIN I (HIGH SENSITIVITY)  TROPONIN I (HIGH SENSITIVITY)    EKG EKG Interpretation  Date/Time:  Thursday December 04 2022 23:01:13 EST Ventricular Rate:  88 PR Interval:  156 QRS Duration: 100 QT Interval:  349 QTC Calculation: 423 R Axis:   70 Text Interpretation: Sinus rhythm Borderline repolarization abnormality Confirmed by Merrily Pew [phone]) on 12/05/2022 12:27:01 AM  Radiology DG Chest 2 View  Result Date: 12/04/2022 CLINICAL DATA:  Cough EXAM: CHEST - 2 VIEW COMPARISON:  Chest radiographs 08/08/2021 and 07/07/2020 FINDINGS: Cardiac silhouette and mediastinal contours are within normal limits. The lungs are clear. No pleural effusion or pneumothorax. No acute skeletal abnormality. IMPRESSION: No active cardiopulmonary disease. Electronically Signed   By: Yvonne Kendall M.D.   On: 12/04/2022 21:37    Procedures Procedures  {Document cardiac monitor, telemetry assessment procedure when appropriate:1}  Medications Ordered in ED Medications - No data to display  ED Course/ Medical Decision Making/ A&P   {   Click here for ABCD2, HEART and other calculatorsREFRESH Note before signing :1}                          Medical Decision Making Amount and/or Complexity of Data Reviewed Labs: ordered.  Radiology: ordered.   ***  {Document critical care time when appropriate:1} {Document review of labs and clinical decision tools ie heart score, Chads2Vasc2 etc:1}  {Document your independent review of radiology images, and any outside records:1} {Document your discussion with family members, caretakers, and with consultants:1} {Document social determinants of health affecting pt's care:1} {Document your decision making why or why not admission, treatments were needed:1} Final Clinical Impression(s) / ED Diagnoses Final diagnoses:  None    Rx / DC Orders ED Discharge Orders     None

## 2022-12-05 NOTE — Discharge Instructions (Addendum)
You were seen in the ER today for evaluation of your cough and cold symptoms, vaginal discharge, as well as chest pain.  Your workup showed that your potassium was low otherwise unremarkable.  Your wet prep did show that you had bacterial vaginosis.  I included more information on bacterial vaginosis into this discharge paperwork.  Please make sure you adhere to the antibiotic and take as prescribed.  Please do not drink on this medication as it make you very sleepy.  For your cough and cold symptoms, I recommend taking over-the-counter cough and cold medication.  I have given you a work note.  For the chest pain, follow-up with your primary care doctor, is likely from your viral illness however have any worsening chest pain, shortness of breath, fever, confusion, neck stiffness, please return to the nearest emergency room for evaluation.  Contact a health care provider if: You have symptoms of a viral illness that do not go away. Your symptoms come back after going away. Your symptoms get worse. Get help right away if you have: Trouble breathing. A severe headache or a stiff neck. Severe vomiting or pain in your abdomen. These symptoms may represent a serious problem that is an emergency. Do not wait to see if the symptoms will go away. Get medical help right away. Call your local emergency services (911 in the [place] Do not drive yourself to the hospital.

## 2022-12-08 LAB — GC/CHLAMYDIA PROBE AMP (~~LOC~~) NOT AT ARMC
Chlamydia: NEGATIVE
Comment: NEGATIVE
Comment: NORMAL
Neisseria Gonorrhea: NEGATIVE

## 2023-02-09 ENCOUNTER — Encounter: Payer: Self-pay | Admitting: *Deleted

## 2023-02-20 ENCOUNTER — Ambulatory Visit: Payer: Medicaid Other | Admitting: Family Medicine

## 2023-02-20 VITALS — BP 110/70 | Ht 64.0 in | Wt 216.0 lb

## 2023-02-20 DIAGNOSIS — M2141 Flat foot [pes planus] (acquired), right foot: Secondary | ICD-10-CM

## 2023-02-20 DIAGNOSIS — M2142 Flat foot [pes planus] (acquired), left foot: Secondary | ICD-10-CM

## 2023-02-20 NOTE — Progress Notes (Signed)
  Melanie Valencia - 27 y.o. female MRN [id number]  Date of birth: 1996/06/04  SUBJECTIVE:  Including CC & ROS.  No chief complaint on file.   Melanie Valencia is a 27 y.o. female that is presenting with acute on chronic bilateral feet pain.  She notices swelling and pain on the lateral portion of the foot and ankle.  No injury or inciting event.  Her symptoms are usually worse at the end of the day after standing for long periods of time.    Review of Systems See HPI   HISTORY: Past Medical, Surgical, Social, and Family History Reviewed & Updated per EMR.   Pertinent Historical Findings include:  Past Medical History:  Diagnosis Date   Anemia    Anxiety    denies   Infection    UTI   Thalassemia trait, alpha     Past Surgical History:  Procedure Laterality Date   NO PAST SURGERIES       PHYSICAL EXAM:  VS: BP 110/70   Ht 5\' 4"  (1.626 m)   Wt 216 lb (98 kg)   BMI 37.08 kg/m  Physical Exam Gen: NAD, alert, cooperative with exam, well-appearing MSK:  Neurovascularly intact       ASSESSMENT & PLAN:   Pes planus of both feet Acute on chronic in nature.  Having pes planus that is contributing to ankle impingement that is leading to the pain and swelling that she experiences in the lateral hindfoot. -Counseled on home exercise therapy and supportive care. - Green sport insoles with scaphoid pad. -Could consider custom orthotics

## 2023-02-20 NOTE — Assessment & Plan Note (Signed)
Acute on chronic in nature.  Having pes planus that is contributing to ankle impingement that is leading to the pain and swelling that she experiences in the lateral hindfoot. -Counseled on home exercise therapy and supportive care. - Green sport insoles with scaphoid pad. -Could consider custom orthotics

## 2023-02-20 NOTE — Patient Instructions (Signed)
Good to see you Please try the exercises  Please try the insoles  We could make custom orthotics going forward if you get improvement with the green insoles   Please send me a message in MyChart with any questions or updates.  Please see me back as needed.   --Dr. Jordan Likes

## 2023-04-13 ENCOUNTER — Encounter: Payer: Medicaid Other | Admitting: Family Medicine

## 2023-09-07 IMAGING — CT CT ABD-PELV W/ CM
2 of 4 series · 16 of 46 positions shown, 18 images · IV contrast (OMNIPAQUE 350)
Comparison: None.

CLINICAL DATA: Abdominal pain, fever, microhematuria

EXAM:
CT ABDOMEN AND PELVIS WITH CONTRAST
TECHNIQUE: Multidetector CT imaging of the abdomen and pelvis was performed
using the standard protocol following bolus administration of
intravenous contrast.
CONTRAST:  80mL OMNIPAQUE IOHEXOL 350 MG/ML SOLN

[Series 2: axial st · axial · 0.68mm/px · z∈[+1036,+1436]mm · 13 of 90 slices shown, 15 images]
[im 5/90  soft-tissue]
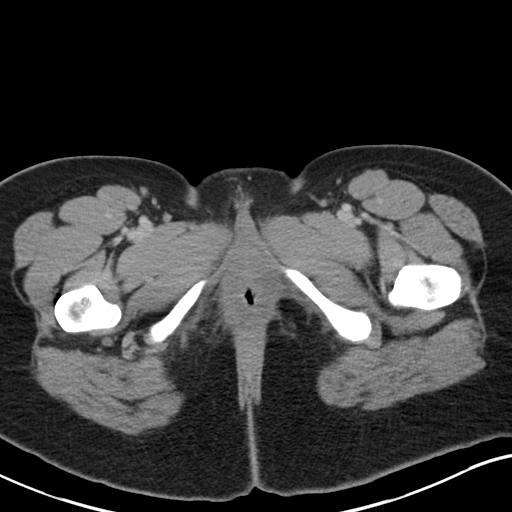
[im 5/90  bone]
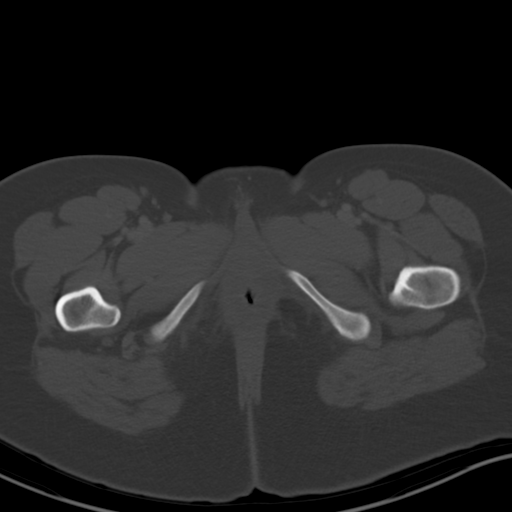
[im 15/90  soft-tissue]
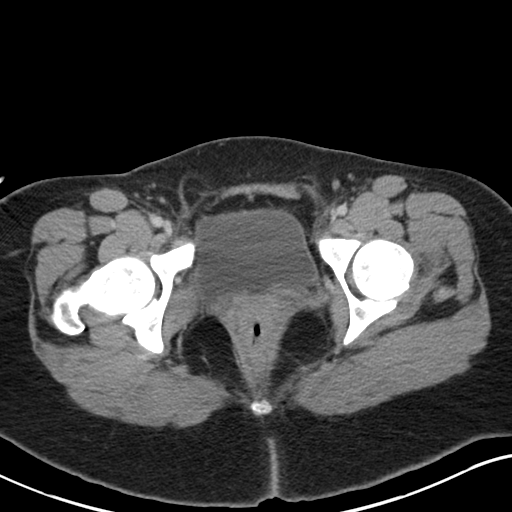
[im 19/90  soft-tissue]
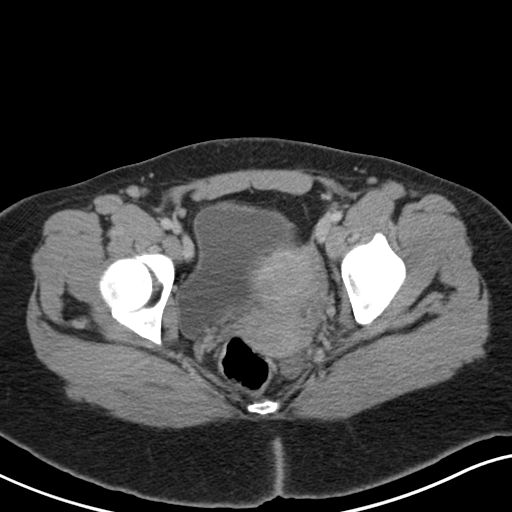
[im 24/90  soft-tissue]
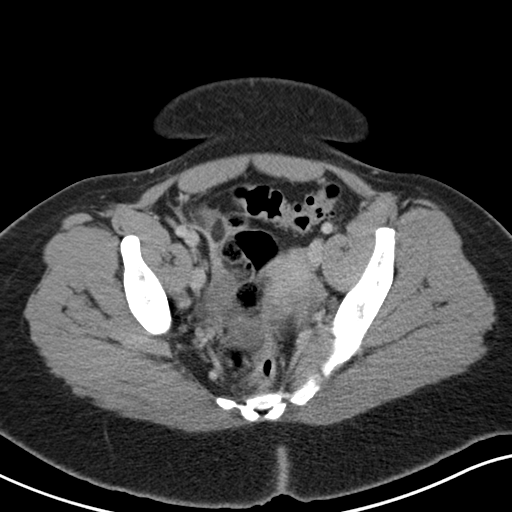
[im 33/90  soft-tissue]
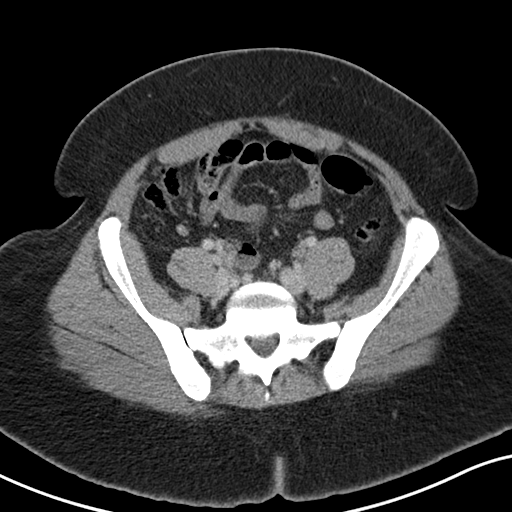
[im 38/90  soft-tissue]
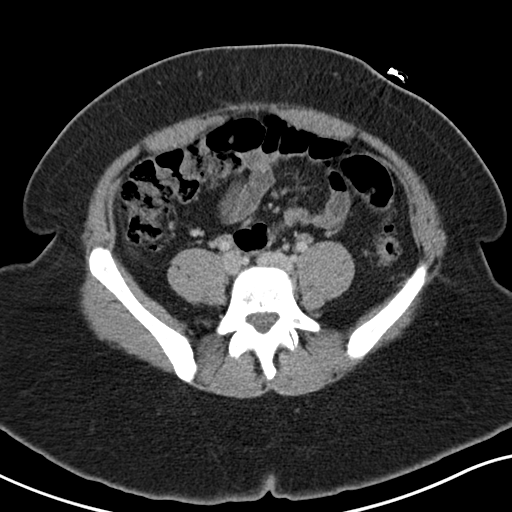
[im 47/90  soft-tissue]
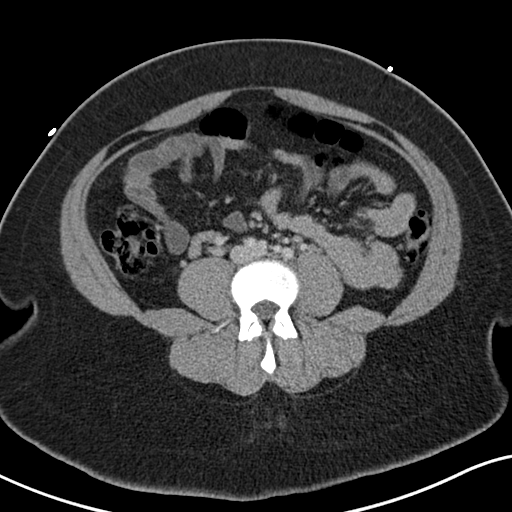
[im 52/90  soft-tissue]
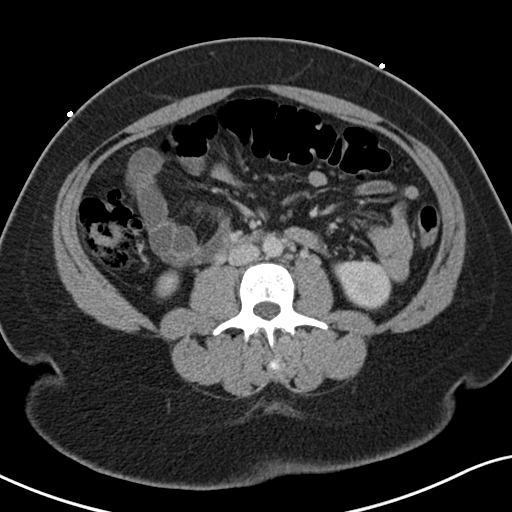
[im 57/90  soft-tissue]
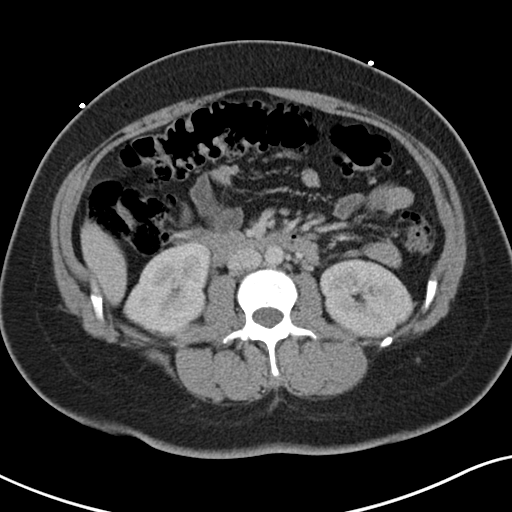
[im 57/90  bone]
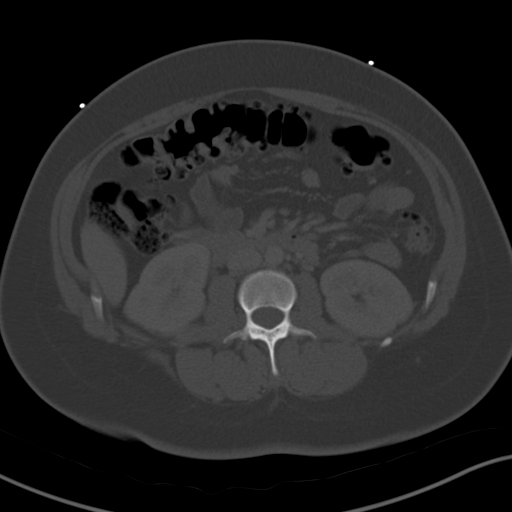
[im 66/90  soft-tissue]
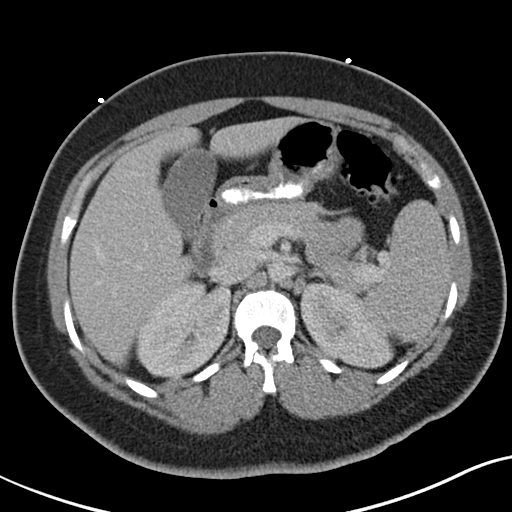
[im 71/90  soft-tissue]
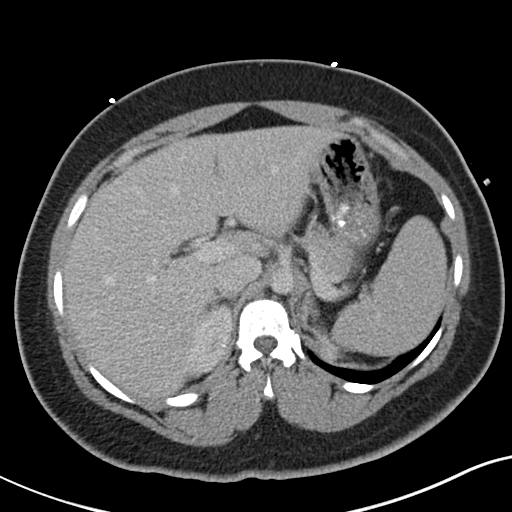
[im 75/90  soft-tissue]
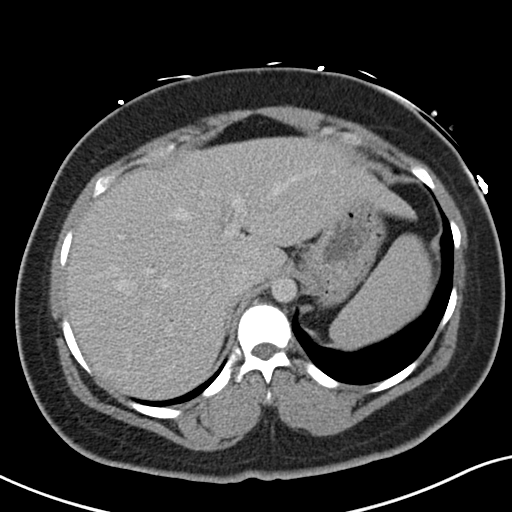
[im 85/90  soft-tissue]
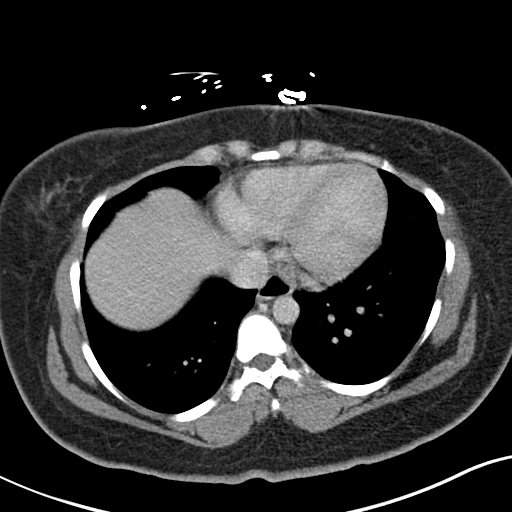

[Series 4: coronal st · coronal · 0.71mm/px · 3 of 151 slices shown]
[im 51/151  soft-tissue]
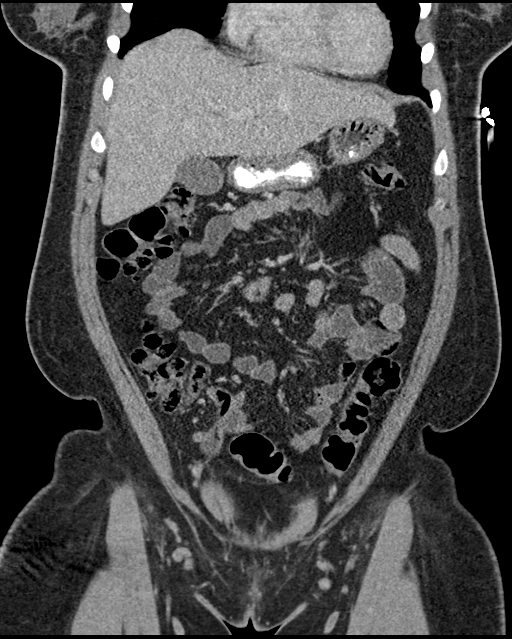
[im 67/151  soft-tissue]
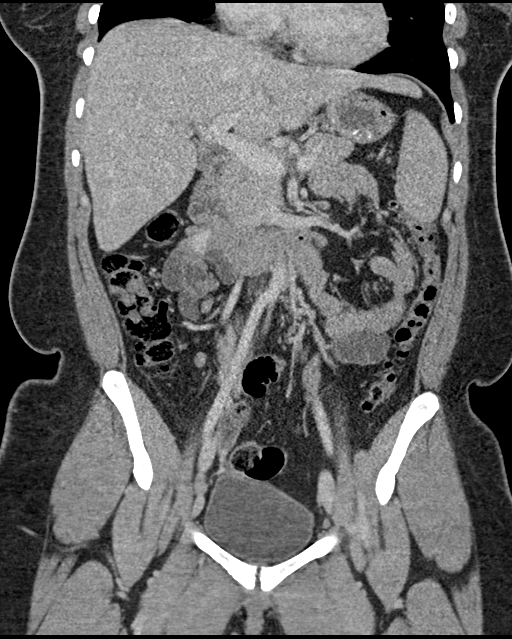
[im 84/151  soft-tissue]
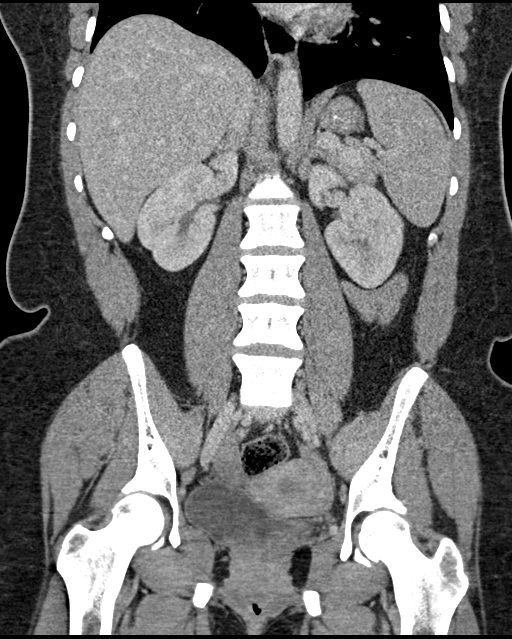

[16 of 46 positions shown; findings below may reference images not displayed]

FINDINGS: Lower chest: No acute abnormality.

Hepatobiliary: No focal liver abnormality is seen. No gallstones,
gallbladder wall thickening, or biliary dilatation.

Pancreas: Unremarkable

Spleen: Unremarkable

Adrenals/Urinary Tract: Adrenal glands are unremarkable. Kidneys are
normal, without renal calculi, focal lesion, or hydronephrosis.
Bladder is unremarkable.

Stomach/Bowel: Moderate pancolonic diverticulosis is present. The
stomach, small bowel, and large bowel are otherwise unremarkable. No
evidence of obstruction or focal inflammation. No free
intraperitoneal gas.

Vascular/Lymphatic: No significant vascular findings are present. No
enlarged abdominal or pelvic lymph nodes.

Reproductive: Corpus luteum noted within the right ovary. The pelvic
organs are otherwise unremarkable.

Other: Tiny fat containing umbilical hernia. Tiny fat containing
right inguinal hernia.

Musculoskeletal: No acute bone abnormality. No lytic or blastic bone
lesion.
IMPRESSION: No acute intra-abdominal pathology identified. No definite
radiographic explanation for the patient's reported symptoms.

Moderate pancolonic diverticulosis.
